# Patient Record
Sex: Female | Born: 1945
Health system: Southern US, Community
[De-identification: ages and names within clinical notes are randomized; demographics above are authoritative.]

## PROBLEM LIST (undated history)

## (undated) DIAGNOSIS — I4510 Unspecified right bundle-branch block: Secondary | ICD-10-CM

## (undated) DIAGNOSIS — K219 Gastro-esophageal reflux disease without esophagitis: Secondary | ICD-10-CM

## (undated) DIAGNOSIS — H269 Unspecified cataract: Secondary | ICD-10-CM

## (undated) DIAGNOSIS — I10 Essential (primary) hypertension: Secondary | ICD-10-CM

## (undated) DIAGNOSIS — J309 Allergic rhinitis, unspecified: Secondary | ICD-10-CM

## (undated) DIAGNOSIS — E785 Hyperlipidemia, unspecified: Secondary | ICD-10-CM

## (undated) DIAGNOSIS — T7840XA Allergy, unspecified, initial encounter: Secondary | ICD-10-CM

## (undated) DIAGNOSIS — M1990 Unspecified osteoarthritis, unspecified site: Secondary | ICD-10-CM

## (undated) DIAGNOSIS — E079 Disorder of thyroid, unspecified: Secondary | ICD-10-CM

## (undated) HISTORY — DX: Unspecified cataract: H26.9

## (undated) HISTORY — DX: Hyperlipidemia, unspecified: E78.5

## (undated) HISTORY — DX: Gastro-esophageal reflux disease without esophagitis: K21.9

## (undated) HISTORY — DX: Essential (primary) hypertension: I10

## (undated) HISTORY — DX: Disorder of thyroid, unspecified: E07.9

## (undated) HISTORY — PX: COLONOSCOPY: SHX174

## (undated) HISTORY — DX: Unspecified osteoarthritis, unspecified site: M19.90

## (undated) HISTORY — DX: Allergy, unspecified, initial encounter: T78.40XA

## (undated) HISTORY — PX: POLYPECTOMY: SHX149

## (undated) HISTORY — DX: Unspecified right bundle-branch block: I45.10

## (undated) HISTORY — DX: Allergic rhinitis, unspecified: J30.9

## (undated) HISTORY — PX: UPPER GASTROINTESTINAL ENDOSCOPY: SHX188

## (undated) HISTORY — PX: ABDOMINAL HYSTERECTOMY: SHX81

---
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
## 1997-10-01 ENCOUNTER — Other Ambulatory Visit: Admission: RE | Admit: 1997-10-01 | Discharge: 1997-10-01 | Payer: Self-pay | Admitting: Obstetrics and Gynecology

## 1998-01-22 ENCOUNTER — Emergency Department (HOSPITAL_COMMUNITY): Admission: EM | Admit: 1998-01-22 | Discharge: 1998-01-22 | Payer: Self-pay | Admitting: Internal Medicine

## 1998-08-03 ENCOUNTER — Other Ambulatory Visit: Admission: RE | Admit: 1998-08-03 | Discharge: 1998-08-03 | Payer: Self-pay | Admitting: Gastroenterology

## 1998-10-05 ENCOUNTER — Other Ambulatory Visit: Admission: RE | Admit: 1998-10-05 | Discharge: 1998-10-05 | Payer: Self-pay | Admitting: Obstetrics and Gynecology

## 1999-05-08 ENCOUNTER — Encounter: Payer: Self-pay | Admitting: Obstetrics and Gynecology

## 1999-05-08 ENCOUNTER — Encounter: Admission: RE | Admit: 1999-05-08 | Discharge: 1999-05-08 | Payer: Self-pay | Admitting: Obstetrics and Gynecology

## 1999-10-09 ENCOUNTER — Other Ambulatory Visit: Admission: RE | Admit: 1999-10-09 | Discharge: 1999-10-09 | Payer: Self-pay | Admitting: Obstetrics and Gynecology

## 2000-02-13 ENCOUNTER — Encounter: Admission: RE | Admit: 2000-02-13 | Discharge: 2000-02-13 | Payer: Self-pay | Admitting: Endocrinology

## 2000-02-13 ENCOUNTER — Encounter: Payer: Self-pay | Admitting: Endocrinology

## 2000-05-08 ENCOUNTER — Encounter: Payer: Self-pay | Admitting: Obstetrics and Gynecology

## 2000-05-08 ENCOUNTER — Encounter: Admission: RE | Admit: 2000-05-08 | Discharge: 2000-05-08 | Payer: Self-pay | Admitting: Obstetrics and Gynecology

## 2000-10-21 ENCOUNTER — Other Ambulatory Visit: Admission: RE | Admit: 2000-10-21 | Discharge: 2000-10-21 | Payer: Self-pay | Admitting: Obstetrics and Gynecology

## 2001-05-12 ENCOUNTER — Encounter: Admission: RE | Admit: 2001-05-12 | Discharge: 2001-05-12 | Payer: Self-pay | Admitting: Obstetrics and Gynecology

## 2001-05-12 ENCOUNTER — Encounter: Payer: Self-pay | Admitting: Obstetrics and Gynecology

## 2002-05-13 ENCOUNTER — Encounter: Admission: RE | Admit: 2002-05-13 | Discharge: 2002-05-13 | Payer: Self-pay | Admitting: Obstetrics and Gynecology

## 2002-05-13 ENCOUNTER — Encounter: Payer: Self-pay | Admitting: Obstetrics and Gynecology

## 2005-07-23 ENCOUNTER — Ambulatory Visit: Payer: Self-pay | Admitting: Gastroenterology

## 2005-08-08 ENCOUNTER — Ambulatory Visit: Payer: Self-pay | Admitting: Gastroenterology

## 2005-11-15 ENCOUNTER — Ambulatory Visit: Payer: Self-pay | Admitting: Cardiology

## 2005-12-06 ENCOUNTER — Ambulatory Visit: Payer: Self-pay

## 2005-12-06 ENCOUNTER — Encounter: Payer: Self-pay | Admitting: Cardiology

## 2009-09-30 ENCOUNTER — Encounter: Payer: Self-pay | Admitting: Cardiology

## 2009-09-30 ENCOUNTER — Ambulatory Visit: Payer: Self-pay

## 2009-09-30 ENCOUNTER — Ambulatory Visit: Payer: Self-pay | Admitting: Internal Medicine

## 2009-09-30 ENCOUNTER — Ambulatory Visit (HOSPITAL_COMMUNITY): Admission: RE | Admit: 2009-09-30 | Discharge: 2009-09-30 | Payer: Self-pay | Admitting: Endocrinology

## 2009-09-30 DIAGNOSIS — R002 Palpitations: Secondary | ICD-10-CM | POA: Insufficient documentation

## 2010-07-20 ENCOUNTER — Other Ambulatory Visit: Payer: Self-pay | Admitting: Obstetrics and Gynecology

## 2010-07-20 NOTE — Procedures (Signed)
Summary: Summary Report  Summary Report   Imported By: Erle Crocker 12/15/2009 11:15:58  _____________________________________________________________________  External Attachment:    Type:   Image     Comment:   External Document

## 2010-07-20 NOTE — Miscellaneous (Signed)
Summary: Orders Update  Clinical Lists Changes  Problems: Added new problem of PALPITATIONS (ICD-785.1) Orders: Added new Referral order of Echocardiogram (Echo) - Signed Added new Referral order of Holter Monitor (Holter Monitor) - Signed

## 2010-07-20 NOTE — Letter (Signed)
Summary: [facility] Office Note  [facility] Office Note   Imported By: Roderic Ovens 09/30/2009 13:30:26  _____________________________________________________________________  External Attachment:    Type:   Image     Comment:   External Document

## 2010-07-26 ENCOUNTER — Encounter (INDEPENDENT_AMBULATORY_CARE_PROVIDER_SITE_OTHER): Payer: Self-pay | Admitting: *Deleted

## 2010-07-28 ENCOUNTER — Encounter: Payer: Self-pay | Admitting: Gastroenterology

## 2010-08-03 NOTE — Miscellaneous (Signed)
Summary: [facility]  Clinical Lists Changes  Medications: Added new medication of MOVIPREP 100 GM  SOLR (PEG-KCL-NACL-NASULF-NA ASC-C) As per prep instructions. - Signed Rx of MOVIPREP 100 GM  SOLR (PEG-KCL-NACL-NASULF-NA ASC-C) As per prep instructions.;  #1 x 0;  Signed;  Entered by: Ezra Sites RN;  Authorized by: Meryl Dare MD [facility];  Method used: Electronically to [facility] # [phone]*, [address], [place], [place]  [address], Ph: [phone], Fax: [phone] Observations: Added new observation of NKA: T (07/28/2010 9:52)    Prescriptions: MOVIPREP 100 GM  SOLR (PEG-KCL-NACL-NASULF-NA ASC-C) As per prep instructions.  #1 x 0   Entered by:   Ezra Sites RN   Authorized by:   Meryl Dare MD [facility]   Signed by:   Ezra Sites RN on 07/28/2010   Method used:   Electronically to        [facility] # [phone]* (retail)       [address] [place], [place]  [address]       Ph: [phone]       Fax: [phone]   RxID:   [id number]

## 2010-08-03 NOTE — Letter (Signed)
Summary: Moviprep Instructions  [facility]  [address]   [place], [place] [address]   Phone: [phone]  Fax: [phone]       Kristen Pena    27-Nov-1945    MRN: [id number]        Procedure Day /Date: Monday, 08-21-10     Arrival Time: 10:00 a.m.     Procedure Time: 11:00 a.m.     Location of Procedure:                    x  [facility] (4th Floor)                        PREPARATION FOR COLONOSCOPY WITH MOVIPREP   Starting 5 days prior to your procedure 08-16-10 do not eat nuts, seeds, popcorn, corn, beans, peas,  salads, or any raw vegetables.  Do not take any fiber supplements (e.g. Metamucil, Citrucel, and Benefiber).  THE DAY BEFORE YOUR PROCEDURE         DATE: 08-20-10   DAY: Sunday  1.  Drink clear liquids the entire day-NO SOLID FOOD  2.  Do not drink anything colored red or purple.  Avoid juices with pulp.  No orange juice.  3.  Drink at least 64 oz. (8 glasses) of fluid/clear liquids during the day to prevent dehydration and help the prep work efficiently.  CLEAR LIQUIDS INCLUDE: Water Jello Ice Popsicles Tea (sugar ok, no milk/cream) Powdered fruit flavored drinks Coffee (sugar ok, no milk/cream) Gatorade Juice: apple, white grape, white cranberry  Lemonade Clear bullion, consomm, broth Carbonated beverages (any kind) Strained chicken noodle soup Hard Candy                             4.  In the morning, mix first dose of MoviPrep solution:    Empty 1 Pouch A and 1 Pouch B into the disposable container    Add lukewarm drinking water to the top line of the container. Mix to dissolve    Refrigerate (mixed solution should be used within 24 hrs)  5.  Begin drinking the prep at 5:00 p.m. The MoviPrep container is divided by 4 marks.   Every 15 minutes drink the solution down to the next mark (approximately 8 oz) until the full liter is complete.   6.  Follow completed prep with 16 oz of clear liquid of your choice  (Nothing red or purple).  Continue to drink clear liquids until bedtime.  7.  Before going to bed, mix second dose of MoviPrep solution:    Empty 1 Pouch A and 1 Pouch B into the disposable container    Add lukewarm drinking water to the top line of the container. Mix to dissolve    Refrigerate  THE DAY OF YOUR PROCEDURE      DATE: 08-21-10 DAY: Monday  Beginning at 6:00 a.m. (5 hours before procedure):         1. Every 15 minutes, drink the solution down to the next mark (approx 8 oz) until the full liter is complete.  2. Follow completed prep with 16 oz. of clear liquid of your choice.    3. You may drink clear liquids until 9:00 a.m.  (2 HOURS BEFORE PROCEDURE).   MEDICATION INSTRUCTIONS  Unless otherwise instructed, you should take regular prescription medications with a small sip of water   as early as possible the morning  of your procedure.  Additional medication instructions: Do not take HCTZ day of procedure.         OTHER INSTRUCTIONS  You will need a responsible adult at least 66 years of age to accompany you and drive you home.   This person must remain in the waiting room during your procedure.  Wear loose fitting clothing that is easily removed.  Leave jewelry and other valuables at home.  However, you may wish to bring a book to read or  an iPod/MP3 player to listen to music as you wait for your procedure to start.  Remove all body piercing jewelry and leave at home.  Total time from sign-in until discharge is approximately 2-3 hours.  You should go home directly after your procedure and rest.  You can resume normal activities the  day after your procedure.  The day of your procedure you should not:   Drive   Make legal decisions   Operate machinery   Drink alcohol   Return to work  You will receive specific instructions about eating, activities and medications before you leave.    The above instructions have been reviewed and explained to  me by   Ezra Sites RN  July 28, 2010 10:21 AM     I fully understand and can verbalize these instructions _____________________________ Date _________

## 2010-08-11 ENCOUNTER — Other Ambulatory Visit: Payer: Self-pay | Admitting: Gastroenterology

## 2010-08-21 ENCOUNTER — Encounter: Payer: Self-pay | Admitting: Gastroenterology

## 2010-08-21 ENCOUNTER — Other Ambulatory Visit (AMBULATORY_SURGERY_CENTER): Payer: Medicare Other | Admitting: Gastroenterology

## 2010-08-21 DIAGNOSIS — Z800 Family history of malignant neoplasm of digestive organs: Secondary | ICD-10-CM

## 2010-08-21 DIAGNOSIS — Z1211 Encounter for screening for malignant neoplasm of colon: Secondary | ICD-10-CM

## 2010-08-29 NOTE — Procedures (Addendum)
Summary: Colonoscopy  Patient: Kristen Pena Note: All result statuses are Final unless otherwise noted.  Tests: (1) Colonoscopy (COL)   COL Colonoscopy           DONE (C)     [facility]     [address]     [place], [place]  [address]          COLONOSCOPY PROCEDURE REPORT          PATIENT:  Kristen Pena, Kristen Pena  MR#:  [id number]     BIRTHDATE:  10-10-1945, 64 yrs. old  GENDER:  female     ENDOSCOPIST:  Judie Petit T. Russella Dar, MD, [facility]          PROCEDURE DATE:  08/21/2010     PROCEDURE:  High risk screening colonoscopy G0105     ASA CLASS:  Class II     INDICATIONS:  1) Elevated Risk Screening  2) family history of     colon cancer: mother at 25.     MEDICATIONS:   Fentanyl 75 mcg IV, Versed 9 mg IV     DESCRIPTION OF PROCEDURE:   After the risks benefits and     alternatives of the procedure were thoroughly explained, informed     consent was obtained.  Digital rectal exam was performed and     revealed no abnormalities.   The LB PCF-H180AL [id number] endoscope     was introduced through the anus and advanced to the cecum, which     was identified by both the appendix and ileocecal valve, limited     by a tortuous colon.    The quality of the prep was excellent,     using MoviPrep.  The instrument was then slowly withdrawn as the     colon was fully examined.     <<PROCEDUREIMAGES>>     FINDINGS:  A normal appearing cecum, ileocecal valve, and     appendiceal orifice were identified. The ascending, hepatic     flexure, transverse, splenic flexure, descending, sigmoid colon,     and rectum appeared unremarkable.   Retroflexed views in the     rectum revealed no abnormalities. The time to cecum =  4  minutes.     The scope was then withdrawn (time =  9.5  min) from the patient     and the procedure completed.          COMPLICATIONS:  None          ENDOSCOPIC IMPRESSION:     1) Normal colon          RECOMMENDATIONS:     1) Repeat Colonoscopy in 5 years.       Venita Lick. Russella Dar, MD, [facility]          CC:  Adrian Prince, MD          n.     REVISED:  08/21/2010 12:16 PM     eSIGNED:   Judie Petit T. Marlow Berenguer at 08/21/2010 12:16 PM          Jesusita Oka, [id number]  Note: An exclamation mark (!) indicates a result that was not dispersed into the flowsheet. Document Creation Date: 08/21/2010 12:16 PM _______________________________________________________________________  (1) Order result status: Final Collection or observation date-time: 08/21/2010 11:38 Requested date-time:  Receipt date-time:  Reported date-time:  Referring Physician:   Ordering Physician: Claudette Head [phone]) Specimen Source:  Source: Launa Grill Order Number: [id number] Lab site:

## 2011-08-27 DIAGNOSIS — H1130 Conjunctival hemorrhage, unspecified eye: Secondary | ICD-10-CM | POA: Diagnosis not present

## 2011-08-27 DIAGNOSIS — H31019 Macula scars of posterior pole (postinflammatory) (post-traumatic), unspecified eye: Secondary | ICD-10-CM | POA: Diagnosis not present

## 2011-08-27 DIAGNOSIS — H53009 Unspecified amblyopia, unspecified eye: Secondary | ICD-10-CM | POA: Diagnosis not present

## 2011-08-27 DIAGNOSIS — H04129 Dry eye syndrome of unspecified lacrimal gland: Secondary | ICD-10-CM | POA: Diagnosis not present

## 2011-09-10 DIAGNOSIS — R7401 Elevation of levels of liver transaminase levels: Secondary | ICD-10-CM | POA: Diagnosis not present

## 2011-09-10 DIAGNOSIS — E559 Vitamin D deficiency, unspecified: Secondary | ICD-10-CM | POA: Diagnosis not present

## 2011-09-10 DIAGNOSIS — E0520 Thyrotoxicosis with toxic multinodular goiter without thyrotoxic crisis or storm: Secondary | ICD-10-CM | POA: Diagnosis not present

## 2011-09-10 DIAGNOSIS — E785 Hyperlipidemia, unspecified: Secondary | ICD-10-CM | POA: Diagnosis not present

## 2011-09-10 DIAGNOSIS — R7402 Elevation of levels of lactic acid dehydrogenase (LDH): Secondary | ICD-10-CM | POA: Diagnosis not present

## 2011-09-28 DIAGNOSIS — K1120 Sialoadenitis, unspecified: Secondary | ICD-10-CM | POA: Diagnosis not present

## 2011-10-03 DIAGNOSIS — Z01419 Encounter for gynecological examination (general) (routine) without abnormal findings: Secondary | ICD-10-CM | POA: Diagnosis not present

## 2011-10-03 DIAGNOSIS — Z1231 Encounter for screening mammogram for malignant neoplasm of breast: Secondary | ICD-10-CM | POA: Diagnosis not present

## 2011-10-03 DIAGNOSIS — Z124 Encounter for screening for malignant neoplasm of cervix: Secondary | ICD-10-CM | POA: Diagnosis not present

## 2011-12-28 DIAGNOSIS — E559 Vitamin D deficiency, unspecified: Secondary | ICD-10-CM | POA: Diagnosis not present

## 2011-12-28 DIAGNOSIS — R209 Unspecified disturbances of skin sensation: Secondary | ICD-10-CM | POA: Diagnosis not present

## 2011-12-28 DIAGNOSIS — E0520 Thyrotoxicosis with toxic multinodular goiter without thyrotoxic crisis or storm: Secondary | ICD-10-CM | POA: Diagnosis not present

## 2011-12-28 DIAGNOSIS — Z0000 Encounter for general adult medical examination without abnormal findings: Secondary | ICD-10-CM | POA: Diagnosis not present

## 2012-01-04 DIAGNOSIS — Q828 Other specified congenital malformations of skin: Secondary | ICD-10-CM | POA: Diagnosis not present

## 2012-01-04 DIAGNOSIS — B079 Viral wart, unspecified: Secondary | ICD-10-CM | POA: Diagnosis not present

## 2012-03-14 DIAGNOSIS — R7401 Elevation of levels of liver transaminase levels: Secondary | ICD-10-CM | POA: Diagnosis not present

## 2012-03-14 DIAGNOSIS — E559 Vitamin D deficiency, unspecified: Secondary | ICD-10-CM | POA: Diagnosis not present

## 2012-03-14 DIAGNOSIS — E0520 Thyrotoxicosis with toxic multinodular goiter without thyrotoxic crisis or storm: Secondary | ICD-10-CM | POA: Diagnosis not present

## 2012-03-14 DIAGNOSIS — Z23 Encounter for immunization: Secondary | ICD-10-CM | POA: Diagnosis not present

## 2012-03-14 DIAGNOSIS — E785 Hyperlipidemia, unspecified: Secondary | ICD-10-CM | POA: Diagnosis not present

## 2012-03-14 DIAGNOSIS — R7402 Elevation of levels of lactic acid dehydrogenase (LDH): Secondary | ICD-10-CM | POA: Diagnosis not present

## 2012-09-25 DIAGNOSIS — M899 Disorder of bone, unspecified: Secondary | ICD-10-CM | POA: Diagnosis not present

## 2012-09-25 DIAGNOSIS — E785 Hyperlipidemia, unspecified: Secondary | ICD-10-CM | POA: Diagnosis not present

## 2012-09-25 DIAGNOSIS — M949 Disorder of cartilage, unspecified: Secondary | ICD-10-CM | POA: Diagnosis not present

## 2012-09-25 DIAGNOSIS — E0520 Thyrotoxicosis with toxic multinodular goiter without thyrotoxic crisis or storm: Secondary | ICD-10-CM | POA: Diagnosis not present

## 2012-10-02 DIAGNOSIS — H2510 Age-related nuclear cataract, unspecified eye: Secondary | ICD-10-CM | POA: Diagnosis not present

## 2012-10-02 DIAGNOSIS — H04129 Dry eye syndrome of unspecified lacrimal gland: Secondary | ICD-10-CM | POA: Diagnosis not present

## 2012-10-07 DIAGNOSIS — M899 Disorder of bone, unspecified: Secondary | ICD-10-CM | POA: Diagnosis not present

## 2012-10-07 DIAGNOSIS — R7402 Elevation of levels of lactic acid dehydrogenase (LDH): Secondary | ICD-10-CM | POA: Diagnosis not present

## 2012-10-07 DIAGNOSIS — E559 Vitamin D deficiency, unspecified: Secondary | ICD-10-CM | POA: Diagnosis not present

## 2012-10-07 DIAGNOSIS — M949 Disorder of cartilage, unspecified: Secondary | ICD-10-CM | POA: Diagnosis not present

## 2012-10-07 DIAGNOSIS — E0520 Thyrotoxicosis with toxic multinodular goiter without thyrotoxic crisis or storm: Secondary | ICD-10-CM | POA: Diagnosis not present

## 2012-10-07 DIAGNOSIS — E785 Hyperlipidemia, unspecified: Secondary | ICD-10-CM | POA: Diagnosis not present

## 2012-10-07 DIAGNOSIS — R7401 Elevation of levels of liver transaminase levels: Secondary | ICD-10-CM | POA: Diagnosis not present

## 2012-10-28 DIAGNOSIS — Z1231 Encounter for screening mammogram for malignant neoplasm of breast: Secondary | ICD-10-CM | POA: Diagnosis not present

## 2012-10-28 DIAGNOSIS — Z1289 Encounter for screening for malignant neoplasm of other sites: Secondary | ICD-10-CM | POA: Diagnosis not present

## 2012-11-04 DIAGNOSIS — Z1211 Encounter for screening for malignant neoplasm of colon: Secondary | ICD-10-CM | POA: Diagnosis not present

## 2012-12-03 DIAGNOSIS — IMO0002 Reserved for concepts with insufficient information to code with codable children: Secondary | ICD-10-CM | POA: Diagnosis not present

## 2012-12-03 DIAGNOSIS — M1710 Unilateral primary osteoarthritis, unspecified knee: Secondary | ICD-10-CM | POA: Diagnosis not present

## 2013-02-04 DIAGNOSIS — IMO0002 Reserved for concepts with insufficient information to code with codable children: Secondary | ICD-10-CM | POA: Diagnosis not present

## 2013-02-04 DIAGNOSIS — M1710 Unilateral primary osteoarthritis, unspecified knee: Secondary | ICD-10-CM | POA: Diagnosis not present

## 2013-02-11 DIAGNOSIS — M1710 Unilateral primary osteoarthritis, unspecified knee: Secondary | ICD-10-CM | POA: Diagnosis not present

## 2013-02-11 DIAGNOSIS — IMO0002 Reserved for concepts with insufficient information to code with codable children: Secondary | ICD-10-CM | POA: Diagnosis not present

## 2013-02-18 DIAGNOSIS — M1710 Unilateral primary osteoarthritis, unspecified knee: Secondary | ICD-10-CM | POA: Diagnosis not present

## 2013-02-18 DIAGNOSIS — IMO0002 Reserved for concepts with insufficient information to code with codable children: Secondary | ICD-10-CM | POA: Diagnosis not present

## 2013-03-11 DIAGNOSIS — M25519 Pain in unspecified shoulder: Secondary | ICD-10-CM | POA: Diagnosis not present

## 2013-03-11 DIAGNOSIS — M19019 Primary osteoarthritis, unspecified shoulder: Secondary | ICD-10-CM | POA: Diagnosis not present

## 2013-03-11 DIAGNOSIS — M7500 Adhesive capsulitis of unspecified shoulder: Secondary | ICD-10-CM | POA: Diagnosis not present

## 2013-03-16 DIAGNOSIS — M25519 Pain in unspecified shoulder: Secondary | ICD-10-CM | POA: Diagnosis not present

## 2013-03-16 DIAGNOSIS — M7500 Adhesive capsulitis of unspecified shoulder: Secondary | ICD-10-CM | POA: Diagnosis not present

## 2013-03-18 DIAGNOSIS — M25519 Pain in unspecified shoulder: Secondary | ICD-10-CM | POA: Diagnosis not present

## 2013-03-18 DIAGNOSIS — M7500 Adhesive capsulitis of unspecified shoulder: Secondary | ICD-10-CM | POA: Diagnosis not present

## 2013-03-20 DIAGNOSIS — M25519 Pain in unspecified shoulder: Secondary | ICD-10-CM | POA: Diagnosis not present

## 2013-03-20 DIAGNOSIS — M7500 Adhesive capsulitis of unspecified shoulder: Secondary | ICD-10-CM | POA: Diagnosis not present

## 2013-03-23 DIAGNOSIS — M25519 Pain in unspecified shoulder: Secondary | ICD-10-CM | POA: Diagnosis not present

## 2013-03-23 DIAGNOSIS — M7500 Adhesive capsulitis of unspecified shoulder: Secondary | ICD-10-CM | POA: Diagnosis not present

## 2013-03-24 DIAGNOSIS — M25519 Pain in unspecified shoulder: Secondary | ICD-10-CM | POA: Diagnosis not present

## 2013-03-24 DIAGNOSIS — M7500 Adhesive capsulitis of unspecified shoulder: Secondary | ICD-10-CM | POA: Diagnosis not present

## 2013-03-25 DIAGNOSIS — IMO0002 Reserved for concepts with insufficient information to code with codable children: Secondary | ICD-10-CM | POA: Diagnosis not present

## 2013-03-25 DIAGNOSIS — M1710 Unilateral primary osteoarthritis, unspecified knee: Secondary | ICD-10-CM | POA: Diagnosis not present

## 2013-03-26 DIAGNOSIS — E559 Vitamin D deficiency, unspecified: Secondary | ICD-10-CM | POA: Diagnosis not present

## 2013-03-26 DIAGNOSIS — E785 Hyperlipidemia, unspecified: Secondary | ICD-10-CM | POA: Diagnosis not present

## 2013-03-26 DIAGNOSIS — E0520 Thyrotoxicosis with toxic multinodular goiter without thyrotoxic crisis or storm: Secondary | ICD-10-CM | POA: Diagnosis not present

## 2013-03-27 DIAGNOSIS — M7500 Adhesive capsulitis of unspecified shoulder: Secondary | ICD-10-CM | POA: Diagnosis not present

## 2013-03-27 DIAGNOSIS — M25519 Pain in unspecified shoulder: Secondary | ICD-10-CM | POA: Diagnosis not present

## 2013-03-30 DIAGNOSIS — M7500 Adhesive capsulitis of unspecified shoulder: Secondary | ICD-10-CM | POA: Diagnosis not present

## 2013-03-30 DIAGNOSIS — M25519 Pain in unspecified shoulder: Secondary | ICD-10-CM | POA: Diagnosis not present

## 2013-04-01 DIAGNOSIS — IMO0002 Reserved for concepts with insufficient information to code with codable children: Secondary | ICD-10-CM | POA: Diagnosis not present

## 2013-04-01 DIAGNOSIS — M1710 Unilateral primary osteoarthritis, unspecified knee: Secondary | ICD-10-CM | POA: Diagnosis not present

## 2013-04-03 DIAGNOSIS — M7500 Adhesive capsulitis of unspecified shoulder: Secondary | ICD-10-CM | POA: Diagnosis not present

## 2013-04-03 DIAGNOSIS — M25519 Pain in unspecified shoulder: Secondary | ICD-10-CM | POA: Diagnosis not present

## 2013-04-06 DIAGNOSIS — M7500 Adhesive capsulitis of unspecified shoulder: Secondary | ICD-10-CM | POA: Diagnosis not present

## 2013-04-06 DIAGNOSIS — M25519 Pain in unspecified shoulder: Secondary | ICD-10-CM | POA: Diagnosis not present

## 2013-04-07 DIAGNOSIS — M25519 Pain in unspecified shoulder: Secondary | ICD-10-CM | POA: Diagnosis not present

## 2013-04-07 DIAGNOSIS — M7500 Adhesive capsulitis of unspecified shoulder: Secondary | ICD-10-CM | POA: Diagnosis not present

## 2013-04-08 DIAGNOSIS — IMO0002 Reserved for concepts with insufficient information to code with codable children: Secondary | ICD-10-CM | POA: Diagnosis not present

## 2013-04-08 DIAGNOSIS — M1710 Unilateral primary osteoarthritis, unspecified knee: Secondary | ICD-10-CM | POA: Diagnosis not present

## 2013-04-10 DIAGNOSIS — IMO0002 Reserved for concepts with insufficient information to code with codable children: Secondary | ICD-10-CM | POA: Diagnosis not present

## 2013-04-10 DIAGNOSIS — E0520 Thyrotoxicosis with toxic multinodular goiter without thyrotoxic crisis or storm: Secondary | ICD-10-CM | POA: Diagnosis not present

## 2013-04-10 DIAGNOSIS — M899 Disorder of bone, unspecified: Secondary | ICD-10-CM | POA: Diagnosis not present

## 2013-04-10 DIAGNOSIS — R7401 Elevation of levels of liver transaminase levels: Secondary | ICD-10-CM | POA: Diagnosis not present

## 2013-04-10 DIAGNOSIS — Z23 Encounter for immunization: Secondary | ICD-10-CM | POA: Diagnosis not present

## 2013-04-10 DIAGNOSIS — E785 Hyperlipidemia, unspecified: Secondary | ICD-10-CM | POA: Diagnosis not present

## 2013-04-10 DIAGNOSIS — R7402 Elevation of levels of lactic acid dehydrogenase (LDH): Secondary | ICD-10-CM | POA: Diagnosis not present

## 2013-04-10 DIAGNOSIS — E559 Vitamin D deficiency, unspecified: Secondary | ICD-10-CM | POA: Diagnosis not present

## 2013-04-13 DIAGNOSIS — M25519 Pain in unspecified shoulder: Secondary | ICD-10-CM | POA: Diagnosis not present

## 2013-04-13 DIAGNOSIS — M7500 Adhesive capsulitis of unspecified shoulder: Secondary | ICD-10-CM | POA: Diagnosis not present

## 2013-04-17 DIAGNOSIS — M25519 Pain in unspecified shoulder: Secondary | ICD-10-CM | POA: Diagnosis not present

## 2013-04-17 DIAGNOSIS — M7500 Adhesive capsulitis of unspecified shoulder: Secondary | ICD-10-CM | POA: Diagnosis not present

## 2013-04-24 DIAGNOSIS — J329 Chronic sinusitis, unspecified: Secondary | ICD-10-CM | POA: Diagnosis not present

## 2013-05-27 DIAGNOSIS — M1710 Unilateral primary osteoarthritis, unspecified knee: Secondary | ICD-10-CM | POA: Diagnosis not present

## 2013-05-27 DIAGNOSIS — IMO0002 Reserved for concepts with insufficient information to code with codable children: Secondary | ICD-10-CM | POA: Diagnosis not present

## 2013-05-29 DIAGNOSIS — J029 Acute pharyngitis, unspecified: Secondary | ICD-10-CM | POA: Diagnosis not present

## 2013-05-29 DIAGNOSIS — Z6825 Body mass index (BMI) 25.0-25.9, adult: Secondary | ICD-10-CM | POA: Diagnosis not present

## 2013-06-24 DIAGNOSIS — N390 Urinary tract infection, site not specified: Secondary | ICD-10-CM | POA: Diagnosis not present

## 2013-06-24 DIAGNOSIS — R300 Dysuria: Secondary | ICD-10-CM | POA: Diagnosis not present

## 2013-06-25 DIAGNOSIS — J3089 Other allergic rhinitis: Secondary | ICD-10-CM | POA: Diagnosis not present

## 2013-06-25 DIAGNOSIS — J301 Allergic rhinitis due to pollen: Secondary | ICD-10-CM | POA: Diagnosis not present

## 2013-06-25 DIAGNOSIS — J3081 Allergic rhinitis due to animal (cat) (dog) hair and dander: Secondary | ICD-10-CM | POA: Diagnosis not present

## 2013-07-04 DIAGNOSIS — J301 Allergic rhinitis due to pollen: Secondary | ICD-10-CM | POA: Diagnosis not present

## 2013-09-24 DIAGNOSIS — E0520 Thyrotoxicosis with toxic multinodular goiter without thyrotoxic crisis or storm: Secondary | ICD-10-CM | POA: Diagnosis not present

## 2013-09-24 DIAGNOSIS — E559 Vitamin D deficiency, unspecified: Secondary | ICD-10-CM | POA: Diagnosis not present

## 2013-09-24 DIAGNOSIS — E785 Hyperlipidemia, unspecified: Secondary | ICD-10-CM | POA: Diagnosis not present

## 2013-10-14 DIAGNOSIS — Z6828 Body mass index (BMI) 28.0-28.9, adult: Secondary | ICD-10-CM | POA: Diagnosis not present

## 2013-10-14 DIAGNOSIS — Z1331 Encounter for screening for depression: Secondary | ICD-10-CM | POA: Diagnosis not present

## 2013-10-14 DIAGNOSIS — E0520 Thyrotoxicosis with toxic multinodular goiter without thyrotoxic crisis or storm: Secondary | ICD-10-CM | POA: Diagnosis not present

## 2013-10-14 DIAGNOSIS — R809 Proteinuria, unspecified: Secondary | ICD-10-CM | POA: Diagnosis not present

## 2013-10-14 DIAGNOSIS — R7402 Elevation of levels of lactic acid dehydrogenase (LDH): Secondary | ICD-10-CM | POA: Diagnosis not present

## 2013-10-14 DIAGNOSIS — E785 Hyperlipidemia, unspecified: Secondary | ICD-10-CM | POA: Diagnosis not present

## 2013-10-14 DIAGNOSIS — R7401 Elevation of levels of liver transaminase levels: Secondary | ICD-10-CM | POA: Diagnosis not present

## 2013-10-14 DIAGNOSIS — R300 Dysuria: Secondary | ICD-10-CM | POA: Diagnosis not present

## 2013-10-14 DIAGNOSIS — E559 Vitamin D deficiency, unspecified: Secondary | ICD-10-CM | POA: Diagnosis not present

## 2013-10-15 DIAGNOSIS — IMO0002 Reserved for concepts with insufficient information to code with codable children: Secondary | ICD-10-CM | POA: Diagnosis not present

## 2013-10-15 DIAGNOSIS — M1710 Unilateral primary osteoarthritis, unspecified knee: Secondary | ICD-10-CM | POA: Diagnosis not present

## 2013-10-18 DIAGNOSIS — N390 Urinary tract infection, site not specified: Secondary | ICD-10-CM | POA: Diagnosis not present

## 2013-10-18 DIAGNOSIS — N3289 Other specified disorders of bladder: Secondary | ICD-10-CM | POA: Diagnosis not present

## 2013-11-12 DIAGNOSIS — H2510 Age-related nuclear cataract, unspecified eye: Secondary | ICD-10-CM | POA: Diagnosis not present

## 2013-11-12 DIAGNOSIS — H04129 Dry eye syndrome of unspecified lacrimal gland: Secondary | ICD-10-CM | POA: Diagnosis not present

## 2013-11-12 DIAGNOSIS — H31009 Unspecified chorioretinal scars, unspecified eye: Secondary | ICD-10-CM | POA: Diagnosis not present

## 2013-11-12 DIAGNOSIS — H524 Presbyopia: Secondary | ICD-10-CM | POA: Diagnosis not present

## 2013-11-13 DIAGNOSIS — N760 Acute vaginitis: Secondary | ICD-10-CM | POA: Diagnosis not present

## 2013-11-13 DIAGNOSIS — Z1231 Encounter for screening mammogram for malignant neoplasm of breast: Secondary | ICD-10-CM | POA: Diagnosis not present

## 2013-11-13 DIAGNOSIS — N390 Urinary tract infection, site not specified: Secondary | ICD-10-CM | POA: Diagnosis not present

## 2014-02-11 DIAGNOSIS — R05 Cough: Secondary | ICD-10-CM | POA: Diagnosis not present

## 2014-02-11 DIAGNOSIS — R059 Cough, unspecified: Secondary | ICD-10-CM | POA: Diagnosis not present

## 2014-02-11 DIAGNOSIS — J3489 Other specified disorders of nose and nasal sinuses: Secondary | ICD-10-CM | POA: Diagnosis not present

## 2014-04-08 DIAGNOSIS — E785 Hyperlipidemia, unspecified: Secondary | ICD-10-CM | POA: Diagnosis not present

## 2014-04-08 DIAGNOSIS — R945 Abnormal results of liver function studies: Secondary | ICD-10-CM | POA: Diagnosis not present

## 2014-04-08 DIAGNOSIS — E0520 Thyrotoxicosis with toxic multinodular goiter without thyrotoxic crisis or storm: Secondary | ICD-10-CM | POA: Diagnosis not present

## 2014-04-14 DIAGNOSIS — E0520 Thyrotoxicosis with toxic multinodular goiter without thyrotoxic crisis or storm: Secondary | ICD-10-CM | POA: Diagnosis not present

## 2014-04-14 DIAGNOSIS — Z23 Encounter for immunization: Secondary | ICD-10-CM | POA: Diagnosis not present

## 2014-04-14 DIAGNOSIS — Z6824 Body mass index (BMI) 24.0-24.9, adult: Secondary | ICD-10-CM | POA: Diagnosis not present

## 2014-04-14 DIAGNOSIS — M8580 Other specified disorders of bone density and structure, unspecified site: Secondary | ICD-10-CM | POA: Diagnosis not present

## 2014-04-14 DIAGNOSIS — E559 Vitamin D deficiency, unspecified: Secondary | ICD-10-CM | POA: Diagnosis not present

## 2014-04-14 DIAGNOSIS — E785 Hyperlipidemia, unspecified: Secondary | ICD-10-CM | POA: Diagnosis not present

## 2014-04-14 DIAGNOSIS — R740 Nonspecific elevation of levels of transaminase and lactic acid dehydrogenase [LDH]: Secondary | ICD-10-CM | POA: Diagnosis not present

## 2014-08-25 DIAGNOSIS — Z6824 Body mass index (BMI) 24.0-24.9, adult: Secondary | ICD-10-CM | POA: Diagnosis not present

## 2014-08-25 DIAGNOSIS — M859 Disorder of bone density and structure, unspecified: Secondary | ICD-10-CM | POA: Diagnosis not present

## 2014-08-25 DIAGNOSIS — R740 Nonspecific elevation of levels of transaminase and lactic acid dehydrogenase [LDH]: Secondary | ICD-10-CM | POA: Diagnosis not present

## 2014-08-25 DIAGNOSIS — E559 Vitamin D deficiency, unspecified: Secondary | ICD-10-CM | POA: Diagnosis not present

## 2014-08-25 DIAGNOSIS — E785 Hyperlipidemia, unspecified: Secondary | ICD-10-CM | POA: Diagnosis not present

## 2014-08-25 DIAGNOSIS — R209 Unspecified disturbances of skin sensation: Secondary | ICD-10-CM | POA: Diagnosis not present

## 2014-08-25 DIAGNOSIS — E0520 Thyrotoxicosis with toxic multinodular goiter without thyrotoxic crisis or storm: Secondary | ICD-10-CM | POA: Diagnosis not present

## 2014-09-03 ENCOUNTER — Ambulatory Visit: Payer: Self-pay

## 2014-09-09 DIAGNOSIS — H2513 Age-related nuclear cataract, bilateral: Secondary | ICD-10-CM | POA: Diagnosis not present

## 2014-09-20 DIAGNOSIS — E0520 Thyrotoxicosis with toxic multinodular goiter without thyrotoxic crisis or storm: Secondary | ICD-10-CM | POA: Diagnosis not present

## 2014-09-22 DIAGNOSIS — Z6824 Body mass index (BMI) 24.0-24.9, adult: Secondary | ICD-10-CM | POA: Diagnosis not present

## 2014-09-22 DIAGNOSIS — E785 Hyperlipidemia, unspecified: Secondary | ICD-10-CM | POA: Diagnosis not present

## 2014-09-22 DIAGNOSIS — M859 Disorder of bone density and structure, unspecified: Secondary | ICD-10-CM | POA: Diagnosis not present

## 2014-09-22 DIAGNOSIS — Z1389 Encounter for screening for other disorder: Secondary | ICD-10-CM | POA: Diagnosis not present

## 2014-09-22 DIAGNOSIS — E559 Vitamin D deficiency, unspecified: Secondary | ICD-10-CM | POA: Diagnosis not present

## 2014-09-22 DIAGNOSIS — R740 Nonspecific elevation of levels of transaminase and lactic acid dehydrogenase [LDH]: Secondary | ICD-10-CM | POA: Diagnosis not present

## 2014-09-22 DIAGNOSIS — E0520 Thyrotoxicosis with toxic multinodular goiter without thyrotoxic crisis or storm: Secondary | ICD-10-CM | POA: Diagnosis not present

## 2014-10-01 ENCOUNTER — Ambulatory Visit: Payer: Medicare Other

## 2014-11-04 ENCOUNTER — Encounter: Payer: Self-pay | Admitting: Gastroenterology

## 2014-12-24 ENCOUNTER — Other Ambulatory Visit: Payer: Self-pay | Admitting: Obstetrics and Gynecology

## 2014-12-24 DIAGNOSIS — Z124 Encounter for screening for malignant neoplasm of cervix: Secondary | ICD-10-CM | POA: Diagnosis not present

## 2014-12-24 DIAGNOSIS — Z6824 Body mass index (BMI) 24.0-24.9, adult: Secondary | ICD-10-CM | POA: Diagnosis not present

## 2014-12-24 DIAGNOSIS — Z01419 Encounter for gynecological examination (general) (routine) without abnormal findings: Secondary | ICD-10-CM | POA: Diagnosis not present

## 2014-12-24 DIAGNOSIS — Z1231 Encounter for screening mammogram for malignant neoplasm of breast: Secondary | ICD-10-CM | POA: Diagnosis not present

## 2014-12-24 DIAGNOSIS — M79641 Pain in right hand: Secondary | ICD-10-CM | POA: Diagnosis not present

## 2014-12-27 LAB — CYTOLOGY - PAP

## 2015-01-11 DIAGNOSIS — M25311 Other instability, right shoulder: Secondary | ICD-10-CM | POA: Diagnosis not present

## 2015-01-11 DIAGNOSIS — M65312 Trigger thumb, left thumb: Secondary | ICD-10-CM | POA: Diagnosis not present

## 2015-01-11 DIAGNOSIS — M65311 Trigger thumb, right thumb: Secondary | ICD-10-CM | POA: Diagnosis not present

## 2015-01-28 DIAGNOSIS — Z0000 Encounter for general adult medical examination without abnormal findings: Secondary | ICD-10-CM | POA: Diagnosis not present

## 2015-01-28 DIAGNOSIS — E0520 Thyrotoxicosis with toxic multinodular goiter without thyrotoxic crisis or storm: Secondary | ICD-10-CM | POA: Diagnosis not present

## 2015-01-28 DIAGNOSIS — M79641 Pain in right hand: Secondary | ICD-10-CM | POA: Diagnosis not present

## 2015-01-28 DIAGNOSIS — M79644 Pain in right finger(s): Secondary | ICD-10-CM | POA: Diagnosis not present

## 2015-02-07 DIAGNOSIS — E0520 Thyrotoxicosis with toxic multinodular goiter without thyrotoxic crisis or storm: Secondary | ICD-10-CM | POA: Diagnosis not present

## 2015-02-07 DIAGNOSIS — G5792 Unspecified mononeuropathy of left lower limb: Secondary | ICD-10-CM | POA: Diagnosis not present

## 2015-02-07 DIAGNOSIS — M79641 Pain in right hand: Secondary | ICD-10-CM | POA: Diagnosis not present

## 2015-02-07 DIAGNOSIS — M859 Disorder of bone density and structure, unspecified: Secondary | ICD-10-CM | POA: Diagnosis not present

## 2015-02-07 DIAGNOSIS — Z6825 Body mass index (BMI) 25.0-25.9, adult: Secondary | ICD-10-CM | POA: Diagnosis not present

## 2015-02-07 DIAGNOSIS — E785 Hyperlipidemia, unspecified: Secondary | ICD-10-CM | POA: Diagnosis not present

## 2015-02-07 DIAGNOSIS — G5791 Unspecified mononeuropathy of right lower limb: Secondary | ICD-10-CM | POA: Diagnosis not present

## 2015-02-07 DIAGNOSIS — E559 Vitamin D deficiency, unspecified: Secondary | ICD-10-CM | POA: Diagnosis not present

## 2015-02-07 DIAGNOSIS — R740 Nonspecific elevation of levels of transaminase and lactic acid dehydrogenase [LDH]: Secondary | ICD-10-CM | POA: Diagnosis not present

## 2015-03-04 ENCOUNTER — Ambulatory Visit (INDEPENDENT_AMBULATORY_CARE_PROVIDER_SITE_OTHER): Payer: Medicare Other | Admitting: Podiatry

## 2015-03-04 ENCOUNTER — Encounter: Payer: Self-pay | Admitting: Podiatry

## 2015-03-04 VITALS — BP 101/63 | HR 58 | Resp 16 | Ht 66.0 in | Wt 150.0 lb

## 2015-03-04 DIAGNOSIS — B351 Tinea unguium: Secondary | ICD-10-CM | POA: Diagnosis not present

## 2015-03-04 DIAGNOSIS — L601 Onycholysis: Secondary | ICD-10-CM | POA: Diagnosis not present

## 2015-03-04 DIAGNOSIS — M79606 Pain in leg, unspecified: Secondary | ICD-10-CM | POA: Diagnosis not present

## 2015-03-04 DIAGNOSIS — L603 Nail dystrophy: Secondary | ICD-10-CM | POA: Diagnosis not present

## 2015-03-04 DIAGNOSIS — L608 Other nail disorders: Secondary | ICD-10-CM | POA: Diagnosis not present

## 2015-03-04 NOTE — Progress Notes (Signed)
   Subjective:    Patient ID: Kristen Pena, female    DOB: 07/15/1945, 69 y.o.   MRN: [id number]  HPI    Review of Systems  Musculoskeletal: Positive for back pain and arthralgias.  Skin:       Change in nails  Allergic/Immunologic: Positive for food allergies.  All other systems reviewed and are negative.      Objective:   Physical Exam        Assessment & Plan:

## 2015-03-06 NOTE — Progress Notes (Signed)
Subjective:     Patient ID: Kristen Pena, female   DOB: [birth year], 69 y.o.   MRN: [id number]  HPI patient presents with thick yellow brittle nailbeds 1-5 both feet that are impossible for her to cut and they become painful. States it's been there for a while but she's also concerned about the discoloration and whether any treatment would be of benefit to her   Review of Systems  All other systems reviewed and are negative.      Objective:   Physical Exam  Constitutional: She is oriented to person, place, and time.  Cardiovascular: Intact distal pulses.   Musculoskeletal: Normal range of motion.  Neurological: She is oriented to person, place, and time.  Skin: Skin is warm and dry.  Nursing note and vitals reviewed.  neurovascular status found to be +1 over 4 PT +2 over 4 DP with neurological intact bilateral. Patient's found to have good digital perfusion is well oriented 3 with no equinus condition and I did note that there is adequate hair growth and moderate depression of the arch. Patient's noted to have thick yellow brittle nailbeds 1-5 both feet that are painful in the corners and impossible for her to cut with some crumbling around the hallux nails predominantly     Assessment:     Appears to be mycotic nail infection with atrophy of the nailbed ingrown component and probable hereditary component    Plan:     H&P and conditions reviewed with patient. Today debridement of nailbeds 1-5 both feet accomplished carefully taking out all corners to reduce pressure and ties on wider-type shoes and did do a culture of the nailbed to try to see whether or not fungal component is present which can be treated. Reappoint to recheck

## 2015-03-22 ENCOUNTER — Telehealth: Payer: Self-pay | Admitting: *Deleted

## 2015-03-22 NOTE — Telephone Encounter (Addendum)
Dr. Paulla Dolly reviewed fungal culture of 03/04/2015 as negative, may use [id number].  Left message to call for results.  Pt returned call.  Left message informing pt of orders.

## 2015-04-15 ENCOUNTER — Ambulatory Visit (INDEPENDENT_AMBULATORY_CARE_PROVIDER_SITE_OTHER): Payer: Medicare Other | Admitting: Podiatry

## 2015-04-15 ENCOUNTER — Encounter: Payer: Self-pay | Admitting: Podiatry

## 2015-04-15 VITALS — BP 138/81 | HR 56 | Resp 16

## 2015-04-15 DIAGNOSIS — B351 Tinea unguium: Secondary | ICD-10-CM | POA: Diagnosis not present

## 2015-04-15 DIAGNOSIS — M21619 Bunion of unspecified foot: Secondary | ICD-10-CM

## 2015-04-15 DIAGNOSIS — L603 Nail dystrophy: Secondary | ICD-10-CM | POA: Diagnosis not present

## 2015-04-16 DIAGNOSIS — Z23 Encounter for immunization: Secondary | ICD-10-CM | POA: Diagnosis not present

## 2015-04-16 DIAGNOSIS — M65312 Trigger thumb, left thumb: Secondary | ICD-10-CM | POA: Diagnosis not present

## 2015-04-17 NOTE — Progress Notes (Signed)
Subjective:     Patient ID: Kristen Pena, female   DOB: 11-29-1945, 69 y.o.   MRN: [id number]  HPI patient presents stating that there is no fungus but it does seem like her nails look a little bit better. Also concerned about a bunion right foot   Review of Systems     Objective:   Physical Exam Neurovascular status intact muscle strength adequate with nails that are thickened but they are not discolored currently and there is redness around the first metatarsal and right with mild deformity noted    Assessment:     Mycotic nail infection that did not show up on pathology along with structural bunion    Plan:     Revealed and reviewed the results of pathology and at this point I committed wider shoes and debridements to be accomplished. Reappoint as needed

## 2015-05-18 DIAGNOSIS — E0520 Thyrotoxicosis with toxic multinodular goiter without thyrotoxic crisis or storm: Secondary | ICD-10-CM | POA: Diagnosis not present

## 2015-05-18 DIAGNOSIS — M65312 Trigger thumb, left thumb: Secondary | ICD-10-CM | POA: Diagnosis not present

## 2015-05-25 DIAGNOSIS — Z6825 Body mass index (BMI) 25.0-25.9, adult: Secondary | ICD-10-CM | POA: Diagnosis not present

## 2015-05-25 DIAGNOSIS — E786 Lipoprotein deficiency: Secondary | ICD-10-CM | POA: Diagnosis not present

## 2015-05-25 DIAGNOSIS — M859 Disorder of bone density and structure, unspecified: Secondary | ICD-10-CM | POA: Diagnosis not present

## 2015-05-25 DIAGNOSIS — E0520 Thyrotoxicosis with toxic multinodular goiter without thyrotoxic crisis or storm: Secondary | ICD-10-CM | POA: Diagnosis not present

## 2015-05-25 DIAGNOSIS — E559 Vitamin D deficiency, unspecified: Secondary | ICD-10-CM | POA: Diagnosis not present

## 2015-05-25 DIAGNOSIS — R740 Nonspecific elevation of levels of transaminase and lactic acid dehydrogenase [LDH]: Secondary | ICD-10-CM | POA: Diagnosis not present

## 2015-05-25 DIAGNOSIS — E784 Other hyperlipidemia: Secondary | ICD-10-CM | POA: Diagnosis not present

## 2015-07-01 ENCOUNTER — Ambulatory Visit: Payer: Medicare Other | Admitting: Podiatry

## 2015-07-08 ENCOUNTER — Ambulatory Visit: Payer: Medicare Other | Admitting: Podiatry

## 2015-07-15 ENCOUNTER — Telehealth: Payer: Self-pay | Admitting: Gastroenterology

## 2015-07-15 NOTE — Telephone Encounter (Signed)
Patient with chest pain with meals.  She has tried OTC products with minimal relief.  She reports she is a little better this am.  She is scheduled to see Dr. Fuller Plan on 08/02/15 at 3:00

## 2015-07-18 DIAGNOSIS — M79674 Pain in right toe(s): Secondary | ICD-10-CM | POA: Diagnosis not present

## 2015-07-18 DIAGNOSIS — B351 Tinea unguium: Secondary | ICD-10-CM | POA: Diagnosis not present

## 2015-07-18 DIAGNOSIS — M79675 Pain in left toe(s): Secondary | ICD-10-CM | POA: Diagnosis not present

## 2015-07-18 DIAGNOSIS — R2689 Other abnormalities of gait and mobility: Secondary | ICD-10-CM | POA: Diagnosis not present

## 2015-07-28 ENCOUNTER — Encounter: Payer: Self-pay | Admitting: Gastroenterology

## 2015-08-01 DIAGNOSIS — M65312 Trigger thumb, left thumb: Secondary | ICD-10-CM | POA: Diagnosis not present

## 2015-08-02 ENCOUNTER — Encounter: Payer: Self-pay | Admitting: Gastroenterology

## 2015-08-02 ENCOUNTER — Ambulatory Visit (INDEPENDENT_AMBULATORY_CARE_PROVIDER_SITE_OTHER): Payer: Medicare Other | Admitting: Gastroenterology

## 2015-08-02 ENCOUNTER — Ambulatory Visit: Payer: PRIVATE HEALTH INSURANCE | Admitting: Gastroenterology

## 2015-08-02 VITALS — BP 120/68 | HR 72 | Ht 66.0 in | Wt 160.0 lb

## 2015-08-02 DIAGNOSIS — Z800 Family history of malignant neoplasm of digestive organs: Secondary | ICD-10-CM | POA: Diagnosis not present

## 2015-08-02 DIAGNOSIS — K219 Gastro-esophageal reflux disease without esophagitis: Secondary | ICD-10-CM

## 2015-08-02 MED ORDER — NA SULFATE-K SULFATE-MG SULF 17.5-3.13-1.6 GM/177ML PO SOLN: 1.0000 | Freq: Once | ORAL | Status: DC

## 2015-08-02 NOTE — Patient Instructions (Signed)
You have been scheduled for an endoscopy and colonoscopy. Please follow the written instructions given to you at your visit today. Please pick up your prep supplies at the pharmacy within the next 1-3 days. If you use inhalers (even only as needed), please bring them with you on the day of your procedure. Your physician has requested that you go to www.startemmi.com and enter the access code given to you at your visit today. This web site gives a general overview about your procedure. However, you should still follow specific instructions given to you by our office regarding your preparation for the procedure.  You can take over the counter Zantac 150 mg twice daily.   Thank you for choosing me and [facility].  Pricilla Riffle. Dagoberto Ligas., MD., Marval Regal  cc: Reynold Bowen, MD

## 2015-08-02 NOTE — Progress Notes (Signed)
    History of Present Illness: This is a 70 year old female referred by Kristen Bowen, MD for the evaluation of GERD. She has a history of GERD for about 20 years. She previously underwent EGD in 2000 showing esophageal erythema and EGD junction stricture. Her reflux has generally been diet controlled for the past several years however she notes a recent change in symptoms with slightly more active reflux symptoms following meals. Denies weight loss, abdominal pain, constipation, diarrhea, change in stool caliber, melena, hematochezia, nausea, vomiting, dysphagia, chest pain.  Review of Systems: Pertinent positive and negative review of systems were noted in the above HPI section. All other review of systems were otherwise negative.  Current Medications, Allergies, Past Medical History, Past Surgical History, Family History and Social History were reviewed in [employer] record.  Physical Exam: General: Well developed, well nourished, no acute distress Head: Normocephalic and atraumatic Eyes:  sclerae anicteric, EOMI Ears: Normal auditory acuity Mouth: No deformity or lesions Neck: Supple, no masses or thyromegaly Lungs: Clear throughout to auscultation Heart: Regular rate and rhythm; no murmurs, rubs or bruits Abdomen: Soft, non tender and non distended. No masses, hepatosplenomegaly or hernias noted. Normal Bowel sounds Rectal: deferred to colonoscopy Musculoskeletal: Symmetrical with no gross deformities  Skin: No lesions on visible extremities Pulses:  Normal pulses noted Extremities: No clubbing, cyanosis, edema or deformities noted Neurological: Alert oriented x 4, grossly nonfocal Cervical Nodes:  No significant cervical adenopathy Inguinal Nodes: No significant inguinal adenopathy Psychological:  Alert and cooperative. Normal mood and affect  Assessment and Recommendations:  1. GERD. Recent flare. Intensify antireflux measures. Begin ranitidine 150 mg  twice daily as needed and Tums when necessary. Rule out esophagitis and Barrett's esophagus. Schedule EGD. The risks (including bleeding, perforation, infection, missed lesions, medication reactions and possible hospitalization or surgery if complications occur), benefits, and alternatives to endoscopy with possible biopsy and possible dilation were discussed with the patient and they consent to proceed.   2. Family history of colon cancer. She is due for a five-year interval surveillance colonoscopy. The risks (including bleeding, perforation, infection, missed lesions, medication reactions and possible hospitalization or surgery if complications occur), benefits, and alternatives to colonoscopy with possible biopsy and possible polypectomy were discussed with the patient and they consent to proceed.    cc: Kristen Bowen, MD [address] [place], [place] [address]

## 2015-09-05 DIAGNOSIS — M65312 Trigger thumb, left thumb: Secondary | ICD-10-CM | POA: Diagnosis not present

## 2015-09-27 ENCOUNTER — Ambulatory Visit (AMBULATORY_SURGERY_CENTER): Payer: Medicare Other | Admitting: Gastroenterology

## 2015-09-27 ENCOUNTER — Encounter: Payer: Self-pay | Admitting: Gastroenterology

## 2015-09-27 VITALS — BP 118/61 | HR 64 | Temp 99.6°F | Resp 13 | Ht 66.0 in | Wt 160.0 lb

## 2015-09-27 DIAGNOSIS — D124 Benign neoplasm of descending colon: Secondary | ICD-10-CM

## 2015-09-27 DIAGNOSIS — I10 Essential (primary) hypertension: Secondary | ICD-10-CM | POA: Diagnosis not present

## 2015-09-27 DIAGNOSIS — K219 Gastro-esophageal reflux disease without esophagitis: Secondary | ICD-10-CM

## 2015-09-27 DIAGNOSIS — K317 Polyp of stomach and duodenum: Secondary | ICD-10-CM | POA: Diagnosis not present

## 2015-09-27 DIAGNOSIS — Z1211 Encounter for screening for malignant neoplasm of colon: Secondary | ICD-10-CM | POA: Diagnosis not present

## 2015-09-27 DIAGNOSIS — Z800 Family history of malignant neoplasm of digestive organs: Secondary | ICD-10-CM | POA: Diagnosis not present

## 2015-09-27 MED ORDER — SODIUM CHLORIDE 0.9 % IV SOLN: 500.0000 mL | INTRAVENOUS | Status: DC

## 2015-09-27 NOTE — Op Note (Signed)
[place] Patient Name: Luther Mosher Procedure Date: 09/27/2015 10:31 AM MRN: [id number] Endoscopist: Ladene Artist MD, MD Age: 70 Date of Birth: 05-Apr-1946 Gender: Female Procedure:                Colonoscopy Indications:              Screening in patient at increased risk: Family                            history of 1st-degree relative with colorectal                            cancer Medicines:                Monitored Anesthesia Care Procedure:                Pre-Anesthesia Assessment:                           - Prior to the procedure, a History and Physical                            was performed, and patient medications and                            allergies were reviewed. The patient's tolerance of                            previous anesthesia was also reviewed. The risks                            and benefits of the procedure and the sedation                            options and risks were discussed with the patient.                            All questions were answered, and informed consent                            was obtained. Prior Anticoagulants: The patient has                            taken no previous anticoagulant or antiplatelet                            agents. ASA Grade Assessment: II - A patient with                            mild systemic disease. After reviewing the risks                            and benefits, the patient was deemed in  satisfactory condition to undergo the procedure.                           After obtaining informed consent, the colonoscope                            was passed under direct vision. Throughout the                            procedure, the patient's blood pressure, pulse, and                            oxygen saturations were monitored continuously. The                            Model PCF-H190L [id number]) scope was introduced                            through the  anus and advanced to the the cecum,                            identified by appendiceal orifice and ileocecal                            valve. The colonoscopy was performed without                            difficulty. The patient tolerated the procedure                            well. The quality of the bowel preparation was                            excellent. The ileocecal valve, appendiceal                            orifice, and rectum were photographed. Scope In: 10:53:15 AM Scope Out: [id number] AM Scope Withdrawal Time: 0 hours 9 minutes 12 seconds  Total Procedure Duration: 0 hours 12 minutes 56 seconds  Findings:                 The digital rectal exam was normal.                           A 5 mm polyp was found in the descending colon. The                            polyp was sessile. The polyp was removed with a                            cold biopsy forceps. Resection and retrieval were                            complete.  The exam was otherwise normal throughout the                            examined colon.                           The retroflexed view of the distal rectum and anal                            verge was normal and showed no anal or rectal                            abnormalities. Complications:            No immediate complications. Estimated Blood Loss:     Estimated blood loss: none. Impression:               - One 5 mm polyp in the descending colon, removed                            with a cold biopsy forceps. Resected and retrieved. Recommendation:           - Patient has a contact number available for                            emergencies. The signs and symptoms of potential                            delayed complications were discussed with the                            patient. Return to normal activities tomorrow.                            Written discharge instructions were provided to the                             patient.                           - Resume previous diet.                           - Continue present medications.                           - Await pathology results.                           - Repeat colonoscopy in 5 years. Ladene Artist MD, MD 09/27/2015 11:15:07 AM This report has been signed electronically.

## 2015-09-27 NOTE — Progress Notes (Signed)
No problems noted in the recovery room. maw 

## 2015-09-27 NOTE — Progress Notes (Signed)
Called to room to assist during endoscopic procedure.  Patient ID and intended procedure confirmed with present staff. Received instructions for my participation in the procedure from the performing physician.  

## 2015-09-27 NOTE — Progress Notes (Signed)
Report to PACU, RN, vss, BBS= Clear.  

## 2015-09-27 NOTE — Op Note (Signed)
[facility] Patient Name: Kristen Pena Procedure Date: 09/27/2015 11:08 AM MRN: [id number] Endoscopist: Ladene Artist MD, MD Age: 70 Date of Birth: 1945/09/21 Gender: Female Procedure:                Upper GI endoscopy Indications:              Gastro-esophageal reflux disease Medicines:                Monitored Anesthesia Care Procedure:                Pre-Anesthesia Assessment:                           - Prior to the procedure, a History and Physical                            was performed, and patient medications and                            allergies were reviewed. The patient's tolerance of                            previous anesthesia was also reviewed. The risks                            and benefits of the procedure and the sedation                            options and risks were discussed with the patient.                            All questions were answered, and informed consent                            was obtained. Prior Anticoagulants: The patient has                            taken no previous anticoagulant or antiplatelet                            agents. ASA Grade Assessment: II - A patient with                            mild systemic disease. After reviewing the risks                            and benefits, the patient was deemed in                            satisfactory condition to undergo the procedure.                           After obtaining informed consent, the endoscope was  passed under direct vision. Throughout the                            procedure, the patient's blood pressure, pulse, and                            oxygen saturations were monitored continuously. The                            Model GIF-[id number]) scope was introduced                            through the mouth, and advanced to the second part                            of duodenum. The upper GI endoscopy was                 accomplished without difficulty. The patient                            tolerated the procedure well. Scope In: Scope Out: Findings:                 Two mild benign-appearing, intrinsic stenoses were                            found in the distal esophagus. And they were                            traversed.                           The exam of the esophagus was otherwise normal.                           A few 3 to 4 mm sessile polyps with no bleeding and                            no stigmata of recent bleeding were found in the                            gastric body. Biopsies were taken with a cold                            forceps for histology.                           A small hiatal hernia was present on retroflexed.                           The exam of the stomach was otherwise normal.                           The cardia and gastric fundus were normal on  retroflexion.                           The duodenal bulb and second portion of the                            duodenum were normal. Complications:            No immediate complications. Estimated Blood Loss:     Estimated blood loss: none. Impression:               - Benign-appearing esophageal stenoses.                           - A few gastric polyps. Biopsied.                           - Small hiatal hernia. Recommendation:           - Patient has a contact number available for                            emergencies. The signs and symptoms of potential                            delayed complications were discussed with the                            patient. Return to normal activities tomorrow.                            Written discharge instructions were provided to the                            patient.                           - Resume previous diet with antireflux measures.                           - Continue present medications.                           - Await  pathology results. Ladene Artist MD, MD 09/27/2015 11:25:23 AM This report has been signed electronically.

## 2015-09-27 NOTE — Patient Instructions (Signed)

## 2015-09-28 ENCOUNTER — Telehealth: Payer: Self-pay

## 2015-09-28 DIAGNOSIS — E0520 Thyrotoxicosis with toxic multinodular goiter without thyrotoxic crisis or storm: Secondary | ICD-10-CM | POA: Diagnosis not present

## 2015-09-28 NOTE — Telephone Encounter (Signed)
  Follow up Call-  Call back number 09/27/2015  Post procedure Call Back phone  # [phone]  Permission to leave phone message Yes     Patient questions:  Do you have a fever, pain , or abdominal swelling? No. Pain Score  0 *  Have you tolerated food without any problems? Yes.    Have you been able to return to your normal activities? Yes.    Do you have any questions about your discharge instructions: Diet   No. Medications  No. Follow up visit  No.  Do you have questions or concerns about your Care? No.  Actions: * If pain score is 4 or above: No action needed, pain <4.   No problems per the pt. maw

## 2015-10-05 ENCOUNTER — Encounter: Payer: Self-pay | Admitting: Gastroenterology

## 2015-10-05 DIAGNOSIS — E559 Vitamin D deficiency, unspecified: Secondary | ICD-10-CM | POA: Diagnosis not present

## 2015-10-05 DIAGNOSIS — M859 Disorder of bone density and structure, unspecified: Secondary | ICD-10-CM | POA: Diagnosis not present

## 2015-10-05 DIAGNOSIS — D126 Benign neoplasm of colon, unspecified: Secondary | ICD-10-CM | POA: Diagnosis not present

## 2015-10-05 DIAGNOSIS — E786 Lipoprotein deficiency: Secondary | ICD-10-CM | POA: Diagnosis not present

## 2015-10-05 DIAGNOSIS — E0520 Thyrotoxicosis with toxic multinodular goiter without thyrotoxic crisis or storm: Secondary | ICD-10-CM | POA: Diagnosis not present

## 2015-10-05 DIAGNOSIS — E785 Hyperlipidemia, unspecified: Secondary | ICD-10-CM | POA: Diagnosis not present

## 2015-10-05 DIAGNOSIS — Z6825 Body mass index (BMI) 25.0-25.9, adult: Secondary | ICD-10-CM | POA: Diagnosis not present

## 2015-10-05 DIAGNOSIS — R740 Nonspecific elevation of levels of transaminase and lactic acid dehydrogenase [LDH]: Secondary | ICD-10-CM | POA: Diagnosis not present

## 2015-10-05 DIAGNOSIS — Z1389 Encounter for screening for other disorder: Secondary | ICD-10-CM | POA: Diagnosis not present

## 2015-10-20 DIAGNOSIS — H2513 Age-related nuclear cataract, bilateral: Secondary | ICD-10-CM | POA: Diagnosis not present

## 2016-03-22 DIAGNOSIS — Z1289 Encounter for screening for malignant neoplasm of other sites: Secondary | ICD-10-CM | POA: Diagnosis not present

## 2016-03-26 DIAGNOSIS — M5442 Lumbago with sciatica, left side: Secondary | ICD-10-CM | POA: Diagnosis not present

## 2016-03-26 DIAGNOSIS — M25552 Pain in left hip: Secondary | ICD-10-CM | POA: Diagnosis not present

## 2016-03-30 DIAGNOSIS — Z1231 Encounter for screening mammogram for malignant neoplasm of breast: Secondary | ICD-10-CM | POA: Diagnosis not present

## 2016-03-30 DIAGNOSIS — E0520 Thyrotoxicosis with toxic multinodular goiter without thyrotoxic crisis or storm: Secondary | ICD-10-CM | POA: Diagnosis not present

## 2016-04-05 DIAGNOSIS — M7541 Impingement syndrome of right shoulder: Secondary | ICD-10-CM | POA: Diagnosis not present

## 2016-04-18 DIAGNOSIS — M5442 Lumbago with sciatica, left side: Secondary | ICD-10-CM | POA: Diagnosis not present

## 2016-04-18 DIAGNOSIS — M25552 Pain in left hip: Secondary | ICD-10-CM | POA: Diagnosis not present

## 2016-04-25 DIAGNOSIS — E0520 Thyrotoxicosis with toxic multinodular goiter without thyrotoxic crisis or storm: Secondary | ICD-10-CM | POA: Diagnosis not present

## 2016-04-25 DIAGNOSIS — E559 Vitamin D deficiency, unspecified: Secondary | ICD-10-CM | POA: Diagnosis not present

## 2016-05-01 DIAGNOSIS — Z23 Encounter for immunization: Secondary | ICD-10-CM | POA: Diagnosis not present

## 2016-05-01 DIAGNOSIS — D126 Benign neoplasm of colon, unspecified: Secondary | ICD-10-CM | POA: Diagnosis not present

## 2016-05-01 DIAGNOSIS — E784 Other hyperlipidemia: Secondary | ICD-10-CM | POA: Diagnosis not present

## 2016-05-01 DIAGNOSIS — E559 Vitamin D deficiency, unspecified: Secondary | ICD-10-CM | POA: Diagnosis not present

## 2016-05-01 DIAGNOSIS — M859 Disorder of bone density and structure, unspecified: Secondary | ICD-10-CM | POA: Diagnosis not present

## 2016-05-01 DIAGNOSIS — R740 Nonspecific elevation of levels of transaminase and lactic acid dehydrogenase [LDH]: Secondary | ICD-10-CM | POA: Diagnosis not present

## 2016-05-01 DIAGNOSIS — E0520 Thyrotoxicosis with toxic multinodular goiter without thyrotoxic crisis or storm: Secondary | ICD-10-CM | POA: Diagnosis not present

## 2016-05-01 DIAGNOSIS — Z6824 Body mass index (BMI) 24.0-24.9, adult: Secondary | ICD-10-CM | POA: Diagnosis not present

## 2016-05-03 DIAGNOSIS — H43812 Vitreous degeneration, left eye: Secondary | ICD-10-CM | POA: Diagnosis not present

## 2016-05-03 DIAGNOSIS — H43392 Other vitreous opacities, left eye: Secondary | ICD-10-CM | POA: Diagnosis not present

## 2016-05-03 DIAGNOSIS — H2513 Age-related nuclear cataract, bilateral: Secondary | ICD-10-CM | POA: Diagnosis not present

## 2016-05-03 DIAGNOSIS — H25013 Cortical age-related cataract, bilateral: Secondary | ICD-10-CM | POA: Diagnosis not present

## 2016-05-04 DIAGNOSIS — M5442 Lumbago with sciatica, left side: Secondary | ICD-10-CM | POA: Diagnosis not present

## 2016-05-16 DIAGNOSIS — M5442 Lumbago with sciatica, left side: Secondary | ICD-10-CM | POA: Diagnosis not present

## 2016-05-23 DIAGNOSIS — M5442 Lumbago with sciatica, left side: Secondary | ICD-10-CM | POA: Diagnosis not present

## 2016-05-28 DIAGNOSIS — M1712 Unilateral primary osteoarthritis, left knee: Secondary | ICD-10-CM | POA: Diagnosis not present

## 2016-05-30 DIAGNOSIS — M5442 Lumbago with sciatica, left side: Secondary | ICD-10-CM | POA: Diagnosis not present

## 2016-06-06 DIAGNOSIS — M5442 Lumbago with sciatica, left side: Secondary | ICD-10-CM | POA: Diagnosis not present

## 2016-06-15 DIAGNOSIS — H43812 Vitreous degeneration, left eye: Secondary | ICD-10-CM | POA: Diagnosis not present

## 2016-06-15 DIAGNOSIS — H43392 Other vitreous opacities, left eye: Secondary | ICD-10-CM | POA: Diagnosis not present

## 2016-06-27 DIAGNOSIS — M5442 Lumbago with sciatica, left side: Secondary | ICD-10-CM | POA: Diagnosis not present

## 2016-07-02 DIAGNOSIS — M1712 Unilateral primary osteoarthritis, left knee: Secondary | ICD-10-CM | POA: Diagnosis not present

## 2016-07-10 DIAGNOSIS — E559 Vitamin D deficiency, unspecified: Secondary | ICD-10-CM | POA: Diagnosis not present

## 2016-07-10 DIAGNOSIS — E0520 Thyrotoxicosis with toxic multinodular goiter without thyrotoxic crisis or storm: Secondary | ICD-10-CM | POA: Diagnosis not present

## 2016-07-11 DIAGNOSIS — M1712 Unilateral primary osteoarthritis, left knee: Secondary | ICD-10-CM | POA: Diagnosis not present

## 2016-07-18 DIAGNOSIS — M1712 Unilateral primary osteoarthritis, left knee: Secondary | ICD-10-CM | POA: Diagnosis not present

## 2016-08-06 ENCOUNTER — Other Ambulatory Visit: Payer: Self-pay | Admitting: Orthopedic Surgery

## 2016-08-06 DIAGNOSIS — M1712 Unilateral primary osteoarthritis, left knee: Secondary | ICD-10-CM

## 2016-08-12 ENCOUNTER — Ambulatory Visit
Admission: RE | Admit: 2016-08-12 | Discharge: 2016-08-12 | Disposition: A | Discharge status: Home / Self Care | Payer: Medicare Other | Source: Ambulatory Visit | Attending: Orthopedic Surgery | Admitting: Orthopedic Surgery

## 2016-08-12 DIAGNOSIS — M1712 Unilateral primary osteoarthritis, left knee: Secondary | ICD-10-CM

## 2016-08-12 DIAGNOSIS — S83242A Other tear of medial meniscus, current injury, left knee, initial encounter: Secondary | ICD-10-CM | POA: Diagnosis not present

## 2016-08-13 ENCOUNTER — Encounter: Payer: Self-pay | Admitting: Neurology

## 2016-08-13 ENCOUNTER — Ambulatory Visit (INDEPENDENT_AMBULATORY_CARE_PROVIDER_SITE_OTHER): Payer: Medicare Other | Admitting: Neurology

## 2016-08-13 VITALS — BP 136/81 | HR 69 | Resp 20 | Ht 66.0 in | Wt 150.0 lb

## 2016-08-13 DIAGNOSIS — R351 Nocturia: Secondary | ICD-10-CM

## 2016-08-13 DIAGNOSIS — R0683 Snoring: Secondary | ICD-10-CM | POA: Diagnosis not present

## 2016-08-13 DIAGNOSIS — G4726 Circadian rhythm sleep disorder, shift work type: Secondary | ICD-10-CM

## 2016-08-13 DIAGNOSIS — R002 Palpitations: Secondary | ICD-10-CM

## 2016-08-13 DIAGNOSIS — I4510 Unspecified right bundle-branch block: Secondary | ICD-10-CM

## 2016-08-13 NOTE — Patient Instructions (Signed)

## 2016-08-13 NOTE — Progress Notes (Signed)
Subjective:    Patient ID: Kristen Pena is a 71 y.o. female.  HPI     Kristen Age, MD, PhD [facility] [address] [place], [place] [address]  Dear Dr. Forde Pena,   I saw your patient, Kristen Pena, upon your kind request in my neurologic clinic today for initial consultation of her sleep disorder, in particular, concern for underlying obstructive sleep apnea. The patient is unaccompanied today. As you know, Kristen Pena is a very pleasant 71 year old right-handed woman with an underlying medical history of toxic multinodular goiter, treated in 1992 with radioactive iodine, osteopenia, vitamin D deficiency, right bundle branch block, and reflux disease and L knee pain, who reports snoring andDifficulty with sleep maintenance in the context of shift work. She also has some nonrestorative sleep. I reviewed your office note from 05/01/2016, which you kindly included. Her Epworth sleepiness score is 5 out of 24, her fatigue score is 9 out of 63 today. She lives alone, has been divorced for over 34 years, and has recently been told by her daughter who was visiting from [place] that she snores. She has 2 grown children, 2 daughters, one in [place], and one in [place]. She is a nonsmoker and does not drink alcohol, denies caffeine use. She has L knee pain for years, has been going to [facility], is s/p steroid injection and 3 rounds of injections.  she denies any telltale symptoms of restless leg syndrome or leg twitching at night.  She has been working as a Warehouse manager for years. She used to work in [place] for 4 years and moved back to [place] in September 2017, started working and earlier shift from 5 AM on words and slept really poorly. She is used to second shift work but still has some problems maintaining sleep at times, she works from 4 PM to 7 PM 5 days a week, Sundays through Thursdays. She has a bedtime around 11 and wake up time is  5 or 6 AM. She usually wakes up spontaneously without an alarm. As a child she was not a great sleeper and did not require as much sleep as her friends or family members. She has recently had a MRI of her left knee. She is awaiting test results. She wakes up with a dry mouth, she has water by her bedside. Of note, she has a history of palpitations and has a diagnosis of a right bundle branch block.  Her Past Medical History Is Significant For: Past Medical History:  Diagnosis Date  . Thyroid disease     Her Past Surgical History Is Significant For: Past Surgical History:  Procedure Laterality Date  . ABDOMINAL HYSTERECTOMY      Her Family History Is Significant For: Family History  Problem Relation Pena of Onset  . Colon cancer Mother 74  . Stomach cancer Father     Her Social History Is Significant For: Social History   Social History  . Marital status: Single    Spouse name: N/A  . Number of children: 2  . Years of education: N/A   Occupational History  . Restaurant    Social History Main Topics  . Smoking status: Never Smoker  . Smokeless tobacco: Never Used  . Alcohol use 0.0 oz/week  . Drug use: No  . Sexual activity: Not Asked   Other Topics Concern  . None   Social History Narrative  . None    Her Allergies Are:  No Known  Allergies:   Her Current Medications Are:  Outpatient Encounter Prescriptions as of 08/13/2016  Medication Sig  . Cholecalciferol (VITAMIN D PO) Take by mouth.  . Fexofenadine HCl (ALLEGRA PO) Take by mouth.  . hydrochlorothiazide (MICROZIDE) 12.5 MG capsule   . montelukast (SINGULAIR) 10 MG tablet   . rosuvastatin (CRESTOR) 10 MG tablet Take 10 mg by mouth 2 (two) times a week.   . Vitamin D, Ergocalciferol, (DRISDOL) 50000 units CAPS capsule Take 1 capsule by mouth 3 (three) times a week.   No facility-administered encounter medications on file as of 08/13/2016.   :  Review of Systems:  Out of a complete 14 point review of  systems, all are reviewed and negative with the exception of these symptoms as listed below: Review of Systems  Neurological:       Pt presents today to discuss her snoring. Her daughter reported to her that she snores but pt recorded herself one night and did not catch any snoring.  Epworth Sleepiness Scale 0= would never doze 1= slight chance of dozing 2= moderate chance of dozing 3= high chance of dozing  Sitting and reading: 1 Watching TV: 1 Sitting inactive in a public place (ex. Theater or meeting): 0 As a passenger in a car for an hour without a break: 1 Lying down to rest in the afternoon: 2 Sitting and talking to someone: 0 Sitting quietly after lunch (no alcohol):0 In a car, while stopped in traffic: 0 Total: 5     Objective:  Neurologic Exam  Physical Exam Physical Examination:   Vitals:   08/13/16 1112  BP: 136/81  Pulse: 69  Resp: 20    General Examination: The patient is a very pleasant 71 y.o. female in no acute distress. She appears well-developed and well-nourished and very well groomed.   HEENT: Normocephalic, atraumatic, pupils are equal, round and reactive to light and accommodation. Funduscopic exam is normal with sharp disc margins noted. Extraocular tracking is good without limitation to gaze excursion or nystagmus noted. Normal smooth pursuit is noted. Hearing is grossly intact. Tympanic membranes are clear bilaterally. Face is symmetric with normal facial animation and normal facial sensation. Speech is clear with no dysarthria noted. There is no hypophonia. There is no lip, neck/head, jaw or voice tremor. Neck is supple with full range of passive and active motion. There are no carotid bruits on auscultation. Oropharynx exam reveals: mild mouth dryness, adequate dental hygiene and moderate airway crowding, due to smaller airway entry, larger uvula and larger tongue, redundant soft palate. Mallampati is class II. Tongue protrudes centrally and palate  elevates symmetrically. Tonsils are small. Neck size is 13.25 inches. She has no overbite and mild malalignment. Nasal inspection reveals no significant nasal mucosal bogginess or redness and no septal deviation.   Chest: Clear to auscultation without wheezing, rhonchi or crackles noted.  Heart: S1+S2+0, regular and normal without murmurs, rubs or gallops noted.   Abdomen: Soft, non-tender and non-distended with normal bowel sounds appreciated on auscultation.  Extremities: There is no pitting edema in the distal lower extremities bilaterally. Pedal pulses are intact.  Skin: Warm and dry without trophic changes noted.  Musculoskeletal: exam reveals no obvious joint deformities, tenderness or joint swelling or erythema.   Neurologically:  Mental status: The patient is awake, alert and oriented in all 4 spheres. Her immediate and remote memory, attention, language skills and fund of knowledge are appropriate. There is no evidence of aphasia, agnosia, apraxia or anomia. Speech is clear with  normal prosody and enunciation. Thought process is linear. Mood is normal and affect is normal.  Cranial nerves II - XII are as described above under HEENT exam. In addition: shoulder shrug is normal with equal shoulder height noted. Motor exam: Normal bulk, strength and tone is noted. There is no drift, tremor or rebound. Romberg is negative. Reflexes are 1+ throughout. Fine motor skills and coordination: intact with normal finger taps, normal hand movements, normal rapid alternating patting, normal foot taps and normal foot agility.  Cerebellar testing: No dysmetria or intention tremor on finger to nose testing. Heel to shin is unremarkable bilaterally. There is no truncal or gait ataxia.  Sensory exam: intact to light touch, pinprick, vibration, temperature sense and  proprioception in the upper and lower extremities.  Gait, station and balance: She stands easily. No veering to one side is noted. No leaning to  one side is noted. Posture is Pena-appropriate and stance is narrow based. Gait shows normal stride length and normal pace. No problems turning are noted. Tandem walk is unremarkable.   Assessment and Plan:  In summary, CHEMIKA VESELY is a very pleasant 71 y.o.-year old female with an underlying medical history of toxic multinodular goiter, treated in 1992 with radioactive iodine, osteopenia, vitamin D deficiency, right bundle branch block, and reflux disease and L knee pain, whose history and physical exam are concerning for underlying obstructive sleep apnea and confounding difficulty secondary to shift work sleep disorder.  I had a long chat with the patient about my findings and the diagnosis of OSA, its prognosis and treatment options. We talked about medical treatments, surgical interventions and non-pharmacological approaches. I explained in particular the risks and ramifications of untreated moderate to severe OSA, especially with respect to developing cardiovascular disease down the Road, including congestive heart failure, difficult to treat hypertension, cardiac arrhythmias, or stroke. Even type 2 diabetes has, in part, been linked to untreated OSA. Symptoms of untreated OSA include daytime sleepiness, memory problems, mood irritability and mood disorder such as depression and anxiety, lack of energy, as well as recurrent headaches, especially morning headaches. We talked about trying to maintain a healthy lifestyle in general, as well as the importance of weight control. I encouraged the patient to eat healthy, exercise daily and keep well hydrated, to keep a scheduled bedtime and wake time routine, to not skip any meals and eat healthy snacks in between meals. I advised the patient not to drive when feeling sleepy. I recommended the following at this time: sleep study with potential positive airway pressure titration. (We will score hypopneas at 4%).   I explained the sleep test procedure to  the patient and also outlined possible surgical and non-surgical treatment options of OSA, including the use of a custom-made dental device (which would require a referral to a specialist dentist or oral surgeon, but she lacks an overbite), upper airway surgical options, such as pillar implants, radiofrequency surgery, tongue base surgery, and UPPP (which would involve a referral to an ENT surgeon). Rarely, jaw surgery such as mandibular advancement may be considered.  I also explained the CPAP treatment option to the patient, who indicated that she would be willing to try CPAP if the need arises. I explained the importance of being compliant with PAP treatment, not only for insurance purposes but primarily to improve Her symptoms, and for the patient's long term health benefit, including to reduce Her cardiovascular risks. I answered all her questions today and the patient  was in agreement. I  would like to see her back after the sleep study is completed and encouraged her to call with any interim questions, concerns, problems or updates.   Thank you very much for allowing me to participate in the care of this nice patient. If I can be of any further assistance to you please do not hesitate to call me at [phone].  Sincerely,   Kristen Age, MD, PhD

## 2016-08-20 DIAGNOSIS — R0683 Snoring: Secondary | ICD-10-CM | POA: Diagnosis not present

## 2016-08-20 DIAGNOSIS — E0520 Thyrotoxicosis with toxic multinodular goiter without thyrotoxic crisis or storm: Secondary | ICD-10-CM | POA: Diagnosis not present

## 2016-08-20 DIAGNOSIS — E784 Other hyperlipidemia: Secondary | ICD-10-CM | POA: Diagnosis not present

## 2016-08-20 DIAGNOSIS — Z1389 Encounter for screening for other disorder: Secondary | ICD-10-CM | POA: Diagnosis not present

## 2016-08-20 DIAGNOSIS — Z6824 Body mass index (BMI) 24.0-24.9, adult: Secondary | ICD-10-CM | POA: Diagnosis not present

## 2016-08-20 DIAGNOSIS — M23304 Other meniscus derangements, unspecified medial meniscus, left knee: Secondary | ICD-10-CM | POA: Diagnosis not present

## 2016-08-20 DIAGNOSIS — D126 Benign neoplasm of colon, unspecified: Secondary | ICD-10-CM | POA: Diagnosis not present

## 2016-08-20 DIAGNOSIS — E559 Vitamin D deficiency, unspecified: Secondary | ICD-10-CM | POA: Diagnosis not present

## 2016-08-22 DIAGNOSIS — M1712 Unilateral primary osteoarthritis, left knee: Secondary | ICD-10-CM | POA: Diagnosis not present

## 2016-08-23 ENCOUNTER — Ambulatory Visit (INDEPENDENT_AMBULATORY_CARE_PROVIDER_SITE_OTHER): Payer: Medicare Other | Admitting: Neurology

## 2016-08-23 DIAGNOSIS — G4733 Obstructive sleep apnea (adult) (pediatric): Secondary | ICD-10-CM

## 2016-08-23 DIAGNOSIS — R9431 Abnormal electrocardiogram [ECG] [EKG]: Secondary | ICD-10-CM

## 2016-08-23 DIAGNOSIS — G4720 Circadian rhythm sleep disorder, unspecified type: Secondary | ICD-10-CM

## 2016-08-24 DIAGNOSIS — M1712 Unilateral primary osteoarthritis, left knee: Secondary | ICD-10-CM | POA: Diagnosis not present

## 2016-08-24 NOTE — Progress Notes (Signed)
Patient referred by Dr. Forde Dandy, seen by me on 08/13/16, diagnostic PSG on 08/23/16.   Please call and notify the patient that the recent sleep study did not show any significant obstructive sleep apnea, only mild OSA during dream/REM sleep. Did not sleep very well and not very much. Please inform patient that I we can go over the details of the study during a follow up appointment. Arrange a followup appointment. Also, route or fax report to PCP and referring MD, if other than PCP.  Once you have spoken to patient, you can close this encounter.   Thanks,  Star Age, MD, PhD [facility]

## 2016-08-24 NOTE — Procedures (Signed)
PATIENT'S NAME:  Kristen Pena, Kristen Pena DOB:      [birth year]      MR#:    [id number]     DATE OF RECORDING: 08/23/2016 REFERRING M.D.:  Reynold Bowen, MD Study Performed:   Baseline Polysomnogram HISTORY: 71 year old woman with a history of toxic multinodular goiter, treated in 1992 with radioactive iodine, osteopenia, vitamin D deficiency, right bundle branch block, and reflux disease and L knee pain, who reports snoring and difficulty with sleep maintenance in the context of shift work. The patient endorsed the Epworth Sleepiness Scale at 5 points. The patient's weight 150 pounds with a height of 66 (inches), resulting in a BMI of 24.1 kg/m2. The patient's neck circumference measured 13.25 inches.  CURRENT MEDICATIONS: Allegra, Microzide, Crestor, Singulair   PROCEDURE:  This is a multichannel digital polysomnogram utilizing the Somnostar 11.2 system.  Electrodes and sensors were applied and monitored per AASM Specifications.   EEG, EOG, Chin and Limb EMG, were sampled at 200 Hz.  ECG, Snore and Nasal Pressure, Thermal Airflow, Respiratory Effort, CPAP Flow and Pressure, Oximetry was sampled at 50 Hz. Digital video and audio were recorded.      BASELINE STUDY  Lights Out was at 22:50 and Lights On at 04:59.  Total recording time (TRT) was 369.5 minutes, with a total sleep time (TST) of  209.5 minutes. The patient's sleep latency was 28.5 minutes, which is prolonged.  REM latency was 55 minutes, which is mildly reduced.  The sleep efficiency was 56.7 %, which is reduced.     SLEEP ARCHITECTURE: WASO (Wake after sleep onset) was 114.5 minutes with moderate sleep fragmentation noted and inability to go back to sleep after 03:04 AM.  There were 14.5 minutes in Stage N1, 95.5 minutes Stage N2, 74.5 minutes Stage N3 and 25 minutes in Stage REM.  The percentage of Stage N1 was 6.9%, Stage N2 was 45.6%, which is normal, Stage N3 was 35.6%, which is increased, and Stage R (REM sleep) was 11.9%, which is reduced.    The arousals were noted as: 23 were spontaneous, 0 were associated with PLMs, 1 were associated with respiratory events.  The patient took no bathroom breaks. No significant snoring was noted. The EKG showed occasional PVCs and was otherwise in keeping with normal sinus rhythm (NSR).  RESPIRATORY ANALYSIS:  There were a total of 5 respiratory events:  1 obstructive apneas, 0 central apneas and 0 mixed apneas with a total of 1 apneas and an apnea index (AI) of .3 /hour. There were 4 hypopneas with a hypopnea index of 1.1 /hour. The patient also had 0 respiratory event related arousals (RERAs).      The total APNEA/HYPOPNEA INDEX (AHI) was 1.4/hour and the total RESPIRATORY DISTURBANCE INDEX was 1.4 /hour.  5 events occurred in REM sleep and 0 events in NREM. The REM AHI was 12 /hour, versus a non-REM AHI of 0. The patient spent 209.5 minutes of total sleep time in the supine position and 0 minutes in non-supine.. The supine AHI was 1.4 versus a non-supine AHI of 0.0.  OXYGEN SATURATION & C02:  The Wake baseline 02 saturation was 98%, with the lowest being 88%. Time spent below 89% saturation equaled 0 minutes.  PERIODIC LIMB MOVEMENTS: The patient had a total of 0 Periodic Limb Movements.  The Periodic Limb Movement (PLM) index was 0 and the PLM Arousal index was 0/hour. Post-study, the patient indicated that sleep was worse than usual.  IMPRESSION:  1. Obstructive Sleep Apnea (OSA), mild REM  related 2. Abnormal nonspecific EKG 3. Dysfunctions associated with sleep stages or arousal from sleep  RECOMMENDATIONS:  1. This study demonstrates an overall normal AHI of 1.4/hour, and evidence of mild obstructive sleep apnea during REM sleep, but no significant snoring and no significant desaturations. Generally speaking, treatment options may include avoidance of supine sleep position along with some weight loss, upper airway or jaw surgery in selected patients or the use of an oral appliance in  certain patients. ENT evaluation and/or consultation with a maxillofacial surgeon or dentist may be feasible in some instances.    2. The study showed occasional PVCs on single lead EKG; clinical correlation is recommended and consultation with cardiology may be feasible.  3. This study shows sleep fragmentation and abnormal sleep stage percentages; these are nonspecific findings and per se do not signify an intrinsic sleep disorder or a cause for the patient's sleep-related symptoms. Causes include (but are not limited to) the first night effect of the sleep study, circadian rhythm disturbances, medication effect or an underlying mood disorder or medical problem.  4. The patient should be cautioned not to drive, work at heights, or operate dangerous or heavy equipment when tired or sleepy. Review and reiteration of good sleep hygiene measures should be pursued with any patient. 5. The patient will be seen in follow-up by Dr. Rexene Alberts at [facility] for discussion of the test results and further management strategies. The referring provider will be notified of the test results.   I certify that I have reviewed the entire raw data recording prior to the issuance of this report in accordance with the Standards of Accreditation of the American Academy of Sleep Medicine (AASM)   Star Age, MD, PhD Diplomat, American Board of Psychiatry and Neurology (Neurology and Sleep Medicine)

## 2016-08-27 DIAGNOSIS — M1712 Unilateral primary osteoarthritis, left knee: Secondary | ICD-10-CM | POA: Diagnosis not present

## 2016-08-28 ENCOUNTER — Telehealth: Payer: Self-pay

## 2016-08-28 NOTE — Telephone Encounter (Signed)
-----   Message from Star Age, MD sent at 08/24/2016  1:01 PM EST ----- Patient referred by Dr. Forde Dandy, seen by me on 08/13/16, diagnostic PSG on 08/23/16.   Please call and notify the patient that the recent sleep study did not show any significant obstructive sleep apnea, only mild OSA during dream/REM sleep. Did not sleep very well and not very much. Please inform patient that I we can go over the details of the study during a follow up appointment. Arrange a followup appointment. Also, route or fax report to PCP and referring MD, if other than PCP.  Once you have spoken to patient, you can close this encounter.   Thanks,  Star Age, MD, PhD [facility])

## 2016-08-28 NOTE — Telephone Encounter (Signed)
I spoke to patient and she is aware of results and recommendations. She did not want to make a f/u at this time but will call back if she changes her mind. I sent report to PCP.

## 2016-08-29 DIAGNOSIS — M1712 Unilateral primary osteoarthritis, left knee: Secondary | ICD-10-CM | POA: Diagnosis not present

## 2016-08-29 DIAGNOSIS — S83242A Other tear of medial meniscus, current injury, left knee, initial encounter: Secondary | ICD-10-CM | POA: Diagnosis not present

## 2016-08-29 DIAGNOSIS — M7052 Other bursitis of knee, left knee: Secondary | ICD-10-CM | POA: Diagnosis not present

## 2016-08-31 DIAGNOSIS — M1712 Unilateral primary osteoarthritis, left knee: Secondary | ICD-10-CM | POA: Diagnosis not present

## 2016-09-06 DIAGNOSIS — M1712 Unilateral primary osteoarthritis, left knee: Secondary | ICD-10-CM | POA: Diagnosis not present

## 2016-09-11 DIAGNOSIS — M1712 Unilateral primary osteoarthritis, left knee: Secondary | ICD-10-CM | POA: Diagnosis not present

## 2016-09-13 DIAGNOSIS — M1712 Unilateral primary osteoarthritis, left knee: Secondary | ICD-10-CM | POA: Diagnosis not present

## 2016-09-18 DIAGNOSIS — M1712 Unilateral primary osteoarthritis, left knee: Secondary | ICD-10-CM | POA: Diagnosis not present

## 2016-09-20 DIAGNOSIS — M1712 Unilateral primary osteoarthritis, left knee: Secondary | ICD-10-CM | POA: Diagnosis not present

## 2016-12-28 DIAGNOSIS — E784 Other hyperlipidemia: Secondary | ICD-10-CM | POA: Diagnosis not present

## 2016-12-28 DIAGNOSIS — E0520 Thyrotoxicosis with toxic multinodular goiter without thyrotoxic crisis or storm: Secondary | ICD-10-CM | POA: Diagnosis not present

## 2017-01-02 DIAGNOSIS — D126 Benign neoplasm of colon, unspecified: Secondary | ICD-10-CM | POA: Diagnosis not present

## 2017-01-02 DIAGNOSIS — M8580 Other specified disorders of bone density and structure, unspecified site: Secondary | ICD-10-CM | POA: Diagnosis not present

## 2017-01-02 DIAGNOSIS — E784 Other hyperlipidemia: Secondary | ICD-10-CM | POA: Diagnosis not present

## 2017-01-02 DIAGNOSIS — E0520 Thyrotoxicosis with toxic multinodular goiter without thyrotoxic crisis or storm: Secondary | ICD-10-CM | POA: Diagnosis not present

## 2017-01-02 DIAGNOSIS — E559 Vitamin D deficiency, unspecified: Secondary | ICD-10-CM | POA: Diagnosis not present

## 2017-01-02 DIAGNOSIS — Z1389 Encounter for screening for other disorder: Secondary | ICD-10-CM | POA: Diagnosis not present

## 2017-01-08 DIAGNOSIS — N611 Abscess of the breast and nipple: Secondary | ICD-10-CM | POA: Diagnosis not present

## 2017-01-08 DIAGNOSIS — N63 Unspecified lump in unspecified breast: Secondary | ICD-10-CM | POA: Diagnosis not present

## 2017-01-10 ENCOUNTER — Ambulatory Visit: Payer: Medicare Other | Admitting: Podiatry

## 2017-01-10 ENCOUNTER — Other Ambulatory Visit: Payer: Self-pay | Admitting: Obstetrics and Gynecology

## 2017-01-10 DIAGNOSIS — N611 Abscess of the breast and nipple: Secondary | ICD-10-CM

## 2017-01-16 ENCOUNTER — Other Ambulatory Visit: Payer: Self-pay | Admitting: Pulmonary Disease

## 2017-01-16 ENCOUNTER — Ambulatory Visit
Admission: RE | Admit: 2017-01-16 | Discharge: 2017-01-16 | Disposition: A | Discharge status: Home / Self Care | Payer: Medicare Other | Source: Ambulatory Visit | Attending: Obstetrics and Gynecology | Admitting: Obstetrics and Gynecology

## 2017-01-16 DIAGNOSIS — R928 Other abnormal and inconclusive findings on diagnostic imaging of breast: Secondary | ICD-10-CM | POA: Diagnosis not present

## 2017-01-16 DIAGNOSIS — N611 Abscess of the breast and nipple: Secondary | ICD-10-CM

## 2017-01-16 DIAGNOSIS — N6489 Other specified disorders of breast: Secondary | ICD-10-CM | POA: Diagnosis not present

## 2017-01-18 ENCOUNTER — Encounter: Payer: Self-pay | Admitting: Podiatry

## 2017-01-18 ENCOUNTER — Ambulatory Visit (INDEPENDENT_AMBULATORY_CARE_PROVIDER_SITE_OTHER): Payer: Medicare Other | Admitting: Podiatry

## 2017-01-18 DIAGNOSIS — B351 Tinea unguium: Secondary | ICD-10-CM | POA: Diagnosis not present

## 2017-01-18 DIAGNOSIS — M79609 Pain in unspecified limb: Secondary | ICD-10-CM

## 2017-01-18 NOTE — Progress Notes (Signed)
Subjective:    Patient ID: Kristen Pena, female   DOB: 71 y.o.   MRN: [id number]   HPI patient presents with elongated nails that are thick and painful 1-5 both feet that makes shoe gear difficult    ROS      Objective:  Physical Exam neurovascular status intact with thick yellow brittle nailbeds 1-5 both feet that are painful     Assessment:    Mycotic nail infection with pain 1-5 both feet     Plan:    Debride painful nailbeds 1-5 both feet with no iatrogenic bleeding noted

## 2017-01-25 ENCOUNTER — Other Ambulatory Visit: Payer: Self-pay | Admitting: Obstetrics and Gynecology

## 2017-01-25 DIAGNOSIS — N611 Abscess of the breast and nipple: Secondary | ICD-10-CM

## 2017-02-05 ENCOUNTER — Other Ambulatory Visit: Payer: Self-pay | Admitting: Obstetrics and Gynecology

## 2017-02-05 ENCOUNTER — Ambulatory Visit
Admission: RE | Admit: 2017-02-05 | Discharge: 2017-02-05 | Disposition: A | Discharge status: Home / Self Care | Payer: Medicare Other | Source: Ambulatory Visit | Attending: Obstetrics and Gynecology | Admitting: Obstetrics and Gynecology

## 2017-02-05 ENCOUNTER — Ambulatory Visit
Admission: RE | Admit: 2017-02-05 | Discharge: 2017-02-05 | Disposition: A | Discharge status: Home / Self Care | Payer: Medicare Other | Source: Ambulatory Visit | Attending: Pulmonary Disease | Admitting: Pulmonary Disease

## 2017-02-05 DIAGNOSIS — N611 Abscess of the breast and nipple: Secondary | ICD-10-CM

## 2017-02-05 DIAGNOSIS — N6489 Other specified disorders of breast: Secondary | ICD-10-CM | POA: Diagnosis not present

## 2017-02-05 DIAGNOSIS — R928 Other abnormal and inconclusive findings on diagnostic imaging of breast: Secondary | ICD-10-CM | POA: Diagnosis not present

## 2017-03-20 DIAGNOSIS — M1712 Unilateral primary osteoarthritis, left knee: Secondary | ICD-10-CM | POA: Diagnosis not present

## 2017-04-01 ENCOUNTER — Ambulatory Visit
Admission: RE | Admit: 2017-04-01 | Discharge: 2017-04-01 | Disposition: A | Discharge status: Home / Self Care | Payer: Medicare Other | Source: Ambulatory Visit | Attending: Obstetrics and Gynecology | Admitting: Obstetrics and Gynecology

## 2017-04-01 ENCOUNTER — Ambulatory Visit: Payer: Medicare Other

## 2017-04-01 DIAGNOSIS — N611 Abscess of the breast and nipple: Secondary | ICD-10-CM

## 2017-04-01 DIAGNOSIS — R922 Inconclusive mammogram: Secondary | ICD-10-CM | POA: Diagnosis not present

## 2017-04-13 DIAGNOSIS — Z23 Encounter for immunization: Secondary | ICD-10-CM | POA: Diagnosis not present

## 2017-04-15 ENCOUNTER — Telehealth: Payer: Self-pay | Admitting: Gastroenterology

## 2017-04-15 NOTE — Telephone Encounter (Signed)
Patient reports that she has a hard stool she is not able to pass.  She is advised to increase the amount of water she is drinking.  Try Miralax TID and can try a glycerine suppository.  She is advised that she should avoid straining.  She had the last BM on Saturday and had no problems.  She verbalized understanding.

## 2017-04-16 DIAGNOSIS — M1712 Unilateral primary osteoarthritis, left knee: Secondary | ICD-10-CM | POA: Diagnosis not present

## 2017-04-18 ENCOUNTER — Ambulatory Visit (INDEPENDENT_AMBULATORY_CARE_PROVIDER_SITE_OTHER): Payer: Medicare Other | Admitting: Physician Assistant

## 2017-04-18 ENCOUNTER — Encounter: Payer: Self-pay | Admitting: Physician Assistant

## 2017-04-18 VITALS — BP 130/70 | HR 81 | Ht 66.0 in | Wt 155.0 lb

## 2017-04-18 DIAGNOSIS — K5900 Constipation, unspecified: Secondary | ICD-10-CM

## 2017-04-18 DIAGNOSIS — Z86010 Personal history of colonic polyps: Secondary | ICD-10-CM | POA: Diagnosis not present

## 2017-04-18 NOTE — Progress Notes (Signed)
Subjective:    Patient ID: Kristen Pena, female    DOB: [birth year], 71 y.o.   MRN: [id number]  HPI [name] is a pleasant 71 year old African-American female, known to Dr. Fuller Plan who comes in today with complaints of acute constipation. She was last seen in 2017 when she underwent colonoscopy in April 2017 for follow-up of polyps and family history of colon cancer. She was found to have a 5 mm polyp in the descending colon, path consistent with tubular adenoma and there were no diverticuli and no hemorrhoids. Patient has history of hyperlipidemia, GERD and obstructive sleep apnea. He says she's usually very regular and has not had any difficulty with constipation. She had to take course of antibiotics about a month ago and became acutely constipated after that. She started eating pears and prunes and taking prune juice but nothing was helping. Finally she called here a few days ago and was advised to try glycerin suppositories and MiraLAX 3 times daily. She says she has been passing hard ball like stools. She had an episode of couple of days ago with passing a very large hard bowel movement which was extremely difficult but once she was able to clear this with some manual assistance she says she passed a very large amount of stool and has felt better. She continues on MiraLAX but has only been taking it once daily. Over the past few days she is having bowel movements but passing just very small amounts. No complaints of abdominal pain or cramping, appetite has been fine, weight stable. No melena or hematochezia.   Review of Systems Pertinent positive and negative review of systems were noted in the above HPI section.  All other review of systems was otherwise negative.  Outpatient Encounter Prescriptions as of 04/18/2017  Medication Sig  . Cholecalciferol (VITAMIN D PO) Take by mouth.  . Fexofenadine HCl (ALLEGRA PO) Take by mouth.  . hydrochlorothiazide (MICROZIDE) 12.5 MG capsule   .  montelukast (SINGULAIR) 10 MG tablet   . rosuvastatin (CRESTOR) 10 MG tablet Take 10 mg by mouth 2 (two) times a week.   . Vitamin D, Ergocalciferol, (DRISDOL) 50000 units CAPS capsule Take 1 capsule by mouth 3 (three) times a week.   No facility-administered encounter medications on file as of 04/18/2017.    No Known Allergies Patient Active Problem List   Diagnosis Date Noted  . PALPITATIONS 09/30/2009   Social History   Social History  . Marital status: Single    Spouse name: N/A  . Number of children: 2  . Years of education: N/A   Occupational History  . Restaurant    Social History Main Topics  . Smoking status: Never Smoker  . Smokeless tobacco: Never Used  . Alcohol use 0.0 oz/week  . Drug use: No  . Sexual activity: Not on file   Other Topics Concern  . Not on file   Social History Narrative  . No narrative on file    Ms. Gettinger's family history includes Colon cancer (age of onset: 29) in her mother; Stomach cancer in her father.      Objective:    Vitals:   04/18/17 1125  BP: 130/70  Pulse: 81    Physical Exam  well-developed older African-American female in no acute distress, very pleasant blood pressure 130/70 pulse 81, BMI 25.0. HEENT; nontraumatic normocephalic EOMI PERRLA sclera anicteric, Cardiovascular; regular rate and rhythm with S1-S2 no murmur or gallop, Pulmonary; clear bilaterally, Abdomen ;soft, nontender nondistended bowel sounds are  active no palpable mass or hepatosplenomegaly, Rectal ;exam not done, Ext; no clubbing cyanosis or edema skin warm and dry, Neuropsych; mood and affect appropriate       Assessment & Plan:   #27 71 year old African-American female with acute episode of severe obstipation, possibly medication induced/antibiotics.  #2 history of adenomatous colon polyps-last colonoscopy April 2017 due for follow-up April 2022 #3 family history of colon cancer #4 obstructive sleep apnea #5 hyperlipidemia  Plan; patient  advised to drink 6-8 glasses of water per day, We will give her a bowel purge with a Plenvu  bowel prep sample, after bowel purge she will continue to drink one glass of prune juice daily, and take MiraLAX 17 g in 8 ounces of water daily High-fiber diet She is advised to call back for advice if she is continuing to have difficulty on above regimen.   Zareena Willis Genia Harold PA-C 04/18/2017   Cc: Reynold Bowen, MD

## 2017-04-18 NOTE — Patient Instructions (Addendum)
Do the Plenvu bowel purge. Follow the instructions.  There are 2 doses. Drink the Mango first. Then the fruit punch.   After purging your bowels. You can take Miralax 17 grams daily in8 oz of water. Drink Prune juice daily.   Call us back for advice if still having problems.   Follow up with Dr. Fuller Plan for colonoscopy in 2022.

## 2017-04-18 NOTE — Progress Notes (Signed)
Reviewed and agree with management plan.  Bernetta Sutley T. Brennan Litzinger, MD FACG 

## 2017-04-22 ENCOUNTER — Ambulatory Visit (INDEPENDENT_AMBULATORY_CARE_PROVIDER_SITE_OTHER): Payer: Medicare Other | Admitting: Podiatry

## 2017-04-22 ENCOUNTER — Encounter: Payer: Self-pay | Admitting: Podiatry

## 2017-04-22 DIAGNOSIS — G629 Polyneuropathy, unspecified: Secondary | ICD-10-CM | POA: Diagnosis not present

## 2017-04-22 DIAGNOSIS — M1712 Unilateral primary osteoarthritis, left knee: Secondary | ICD-10-CM | POA: Diagnosis not present

## 2017-04-22 DIAGNOSIS — B351 Tinea unguium: Secondary | ICD-10-CM | POA: Diagnosis not present

## 2017-04-22 DIAGNOSIS — M79609 Pain in unspecified limb: Secondary | ICD-10-CM

## 2017-04-22 NOTE — Patient Instructions (Signed)
Peripheral Neuropathy Peripheral neuropathy is a type of nerve damage. It affects nerves that carry signals between the spinal cord and other parts of the body. These are called peripheral nerves. With peripheral neuropathy, one nerve or a group of nerves may be damaged. What are the causes? Many things can damage peripheral nerves. For some people with peripheral neuropathy, the cause is unknown. Some causes include:  Diabetes. This is the most common cause of peripheral neuropathy.  Injury to a nerve.  Pressure or stress on a nerve that lasts a long time.  Too little vitamin B. Alcoholism can lead to this.  Infections.  Autoimmune diseases, such as multiple sclerosis and systemic lupus erythematosus.  Inherited nerve diseases.  Some medicines, such as cancer drugs.  Toxic substances, such as lead and mercury.  Too little blood flowing to the legs.  Kidney disease.  Thyroid disease.  What are the signs or symptoms? Different people have different symptoms. The symptoms you have will depend on which of your nerves is damaged. Common symptoms include:  Loss of feeling (numbness) in the feet and hands.  Tingling in the feet and hands.  Pain that burns.  Very sensitive skin.  Weakness.  Not being able to move a part of the body (paralysis).  Muscle twitching.  Clumsiness or poor coordination.  Loss of balance.  Not being able to control your bladder.  Feeling dizzy.  Sexual problems.  How is this diagnosed? Peripheral neuropathy is a symptom, not a disease. Finding the cause of peripheral neuropathy can be hard. To figure that out, your health care provider will take a medical history and do a physical exam. A neurological exam will also be done. This involves checking things affected by your brain, spinal cord, and nerves (nervous system). For example, your health care provider will check your reflexes, how you move, and what you can feel. Other types of tests  may also be ordered, such as:  Blood tests.  A test of the fluid in your spinal cord.  Imaging tests, such as CT scans or an MRI.  Electromyography (EMG). This test checks the nerves that control muscles.  Nerve conduction velocity tests. These tests check how fast messages pass through your nerves.  Nerve biopsy. A small piece of nerve is removed. It is then checked under a microscope.  How is this treated?  Medicine is often used to treat peripheral neuropathy. Medicines may include: ? Pain-relieving medicines. Prescription or over-the-counter medicine may be suggested. ? Antiseizure medicine. This may be used for pain. ? Antidepressants. These also may help ease pain from neuropathy. ? Lidocaine. This is a numbing medicine. You might wear a patch or be given a shot. ? Mexiletine. This medicine is typically used to help control irregular heart rhythms.  Surgery. Surgery may be needed to relieve pressure on a nerve or to destroy a nerve that is causing pain.  Physical therapy to help movement.  Assistive devices to help movement. Follow these instructions at home:  Only take over-the-counter or prescription medicines as directed by your health care provider. Follow the instructions carefully for any given medicines. Do not take any other medicines without first getting approval from your health care provider.  If you have diabetes, work closely with your health care provider to keep your blood sugar under control.  If you have numbness in your feet: ? Check every day for signs of injury or infection. Watch for redness, warmth, and swelling. ? Wear padded socks and comfortable   shoes. These help protect your feet.  Do not do things that put pressure on your damaged nerve.  Do not smoke. Smoking keeps blood from getting to damaged nerves.  Avoid or limit alcohol. Too much alcohol can cause a lack of B vitamins. These vitamins are needed for healthy nerves.  Develop a good  support system. Coping with peripheral neuropathy can be stressful. Talk to a mental health specialist or join a support group if you are struggling.  Follow up with your health care provider as directed. Contact a health care provider if:  You have new signs or symptoms of peripheral neuropathy.  You are struggling emotionally from dealing with peripheral neuropathy.  You have a fever. Get help right away if:  You have an injury or infection that is not healing.  You feel very dizzy or begin vomiting.  You have chest pain.  You have trouble breathing. This information is not intended to replace advice given to you by your health care provider. Make sure you discuss any questions you have with your health care provider. Document Released: 05/25/2002 Document Revised: 11/10/2015 Document Reviewed: 02/09/2013 Elsevier Interactive Patient Education  2017 Elsevier Inc.  

## 2017-04-23 NOTE — Progress Notes (Signed)
Subjective:    Patient ID: Kristen Pena, female   DOB: 71 y.o.   MRN: [id number]   HPI patient presents with painful nailbeds 1-5 both feet and also concerned about numbness in the digits 1 through 3 bilateral    ROS      Objective:  Physical Exam neurovascular status intact with thick yellow brittle nailbeds 1-5 both feet that are painful and mild numbness in the lesser digits but no indications of diminished vibratory sensation bilateral     Assessment:    Mycotic nail infection with pain with possibility for low-grade idiopathic neuropathy condition     Plan:    H&P condition reviewed with patient. At this time nail debridement 1-5 both feet accomplished and I discussed for the neuropathy that she just take a wait-and-see attitude is a don't see any organic issues at this time

## 2017-05-06 DIAGNOSIS — M1712 Unilateral primary osteoarthritis, left knee: Secondary | ICD-10-CM | POA: Diagnosis not present

## 2017-06-25 DIAGNOSIS — E7849 Other hyperlipidemia: Secondary | ICD-10-CM | POA: Diagnosis not present

## 2017-06-25 DIAGNOSIS — E0520 Thyrotoxicosis with toxic multinodular goiter without thyrotoxic crisis or storm: Secondary | ICD-10-CM | POA: Diagnosis not present

## 2017-06-25 DIAGNOSIS — I10 Essential (primary) hypertension: Secondary | ICD-10-CM | POA: Diagnosis not present

## 2017-06-25 DIAGNOSIS — R82998 Other abnormal findings in urine: Secondary | ICD-10-CM | POA: Diagnosis not present

## 2017-06-25 DIAGNOSIS — M859 Disorder of bone density and structure, unspecified: Secondary | ICD-10-CM | POA: Diagnosis not present

## 2017-06-28 DIAGNOSIS — H04123 Dry eye syndrome of bilateral lacrimal glands: Secondary | ICD-10-CM | POA: Diagnosis not present

## 2017-06-28 DIAGNOSIS — H2513 Age-related nuclear cataract, bilateral: Secondary | ICD-10-CM | POA: Diagnosis not present

## 2017-06-28 DIAGNOSIS — H43812 Vitreous degeneration, left eye: Secondary | ICD-10-CM | POA: Diagnosis not present

## 2017-06-28 DIAGNOSIS — H43392 Other vitreous opacities, left eye: Secondary | ICD-10-CM | POA: Diagnosis not present

## 2017-07-02 DIAGNOSIS — E7849 Other hyperlipidemia: Secondary | ICD-10-CM | POA: Diagnosis not present

## 2017-07-02 DIAGNOSIS — Z23 Encounter for immunization: Secondary | ICD-10-CM | POA: Diagnosis not present

## 2017-07-02 DIAGNOSIS — M859 Disorder of bone density and structure, unspecified: Secondary | ICD-10-CM | POA: Diagnosis not present

## 2017-07-02 DIAGNOSIS — M8580 Other specified disorders of bone density and structure, unspecified site: Secondary | ICD-10-CM | POA: Diagnosis not present

## 2017-07-02 DIAGNOSIS — R740 Nonspecific elevation of levels of transaminase and lactic acid dehydrogenase [LDH]: Secondary | ICD-10-CM | POA: Diagnosis not present

## 2017-07-02 DIAGNOSIS — D126 Benign neoplasm of colon, unspecified: Secondary | ICD-10-CM | POA: Diagnosis not present

## 2017-07-02 DIAGNOSIS — E559 Vitamin D deficiency, unspecified: Secondary | ICD-10-CM | POA: Diagnosis not present

## 2017-07-02 DIAGNOSIS — E0520 Thyrotoxicosis with toxic multinodular goiter without thyrotoxic crisis or storm: Secondary | ICD-10-CM | POA: Diagnosis not present

## 2017-07-02 DIAGNOSIS — Z6825 Body mass index (BMI) 25.0-25.9, adult: Secondary | ICD-10-CM | POA: Diagnosis not present

## 2017-07-02 DIAGNOSIS — Z1389 Encounter for screening for other disorder: Secondary | ICD-10-CM | POA: Diagnosis not present

## 2017-07-02 DIAGNOSIS — Z0000 Encounter for general adult medical examination without abnormal findings: Secondary | ICD-10-CM | POA: Diagnosis not present

## 2017-07-02 DIAGNOSIS — R208 Other disturbances of skin sensation: Secondary | ICD-10-CM | POA: Diagnosis not present

## 2017-07-12 DIAGNOSIS — Z1212 Encounter for screening for malignant neoplasm of rectum: Secondary | ICD-10-CM | POA: Diagnosis not present

## 2017-07-15 DIAGNOSIS — J309 Allergic rhinitis, unspecified: Secondary | ICD-10-CM | POA: Diagnosis not present

## 2017-07-15 DIAGNOSIS — J069 Acute upper respiratory infection, unspecified: Secondary | ICD-10-CM | POA: Diagnosis not present

## 2017-07-15 DIAGNOSIS — S60459A Superficial foreign body of unspecified finger, initial encounter: Secondary | ICD-10-CM | POA: Diagnosis not present

## 2017-07-15 DIAGNOSIS — Z6825 Body mass index (BMI) 25.0-25.9, adult: Secondary | ICD-10-CM | POA: Diagnosis not present

## 2017-07-15 DIAGNOSIS — R05 Cough: Secondary | ICD-10-CM | POA: Diagnosis not present

## 2017-07-24 DIAGNOSIS — M1712 Unilateral primary osteoarthritis, left knee: Secondary | ICD-10-CM | POA: Diagnosis not present

## 2017-07-24 DIAGNOSIS — M25561 Pain in right knee: Secondary | ICD-10-CM | POA: Diagnosis not present

## 2017-07-24 DIAGNOSIS — M1711 Unilateral primary osteoarthritis, right knee: Secondary | ICD-10-CM | POA: Diagnosis not present

## 2017-07-24 DIAGNOSIS — M170 Bilateral primary osteoarthritis of knee: Secondary | ICD-10-CM | POA: Diagnosis not present

## 2017-07-29 ENCOUNTER — Other Ambulatory Visit: Payer: Medicare Other

## 2017-08-05 DIAGNOSIS — M25532 Pain in left wrist: Secondary | ICD-10-CM | POA: Diagnosis not present

## 2017-08-30 DIAGNOSIS — M170 Bilateral primary osteoarthritis of knee: Secondary | ICD-10-CM | POA: Diagnosis not present

## 2017-09-06 DIAGNOSIS — M1712 Unilateral primary osteoarthritis, left knee: Secondary | ICD-10-CM | POA: Diagnosis not present

## 2017-09-06 DIAGNOSIS — M170 Bilateral primary osteoarthritis of knee: Secondary | ICD-10-CM | POA: Diagnosis not present

## 2017-09-06 DIAGNOSIS — M1711 Unilateral primary osteoarthritis, right knee: Secondary | ICD-10-CM | POA: Diagnosis not present

## 2017-09-13 DIAGNOSIS — M1712 Unilateral primary osteoarthritis, left knee: Secondary | ICD-10-CM | POA: Diagnosis not present

## 2017-09-13 DIAGNOSIS — M170 Bilateral primary osteoarthritis of knee: Secondary | ICD-10-CM | POA: Diagnosis not present

## 2017-09-13 DIAGNOSIS — M1711 Unilateral primary osteoarthritis, right knee: Secondary | ICD-10-CM | POA: Diagnosis not present

## 2017-12-25 DIAGNOSIS — E0520 Thyrotoxicosis with toxic multinodular goiter without thyrotoxic crisis or storm: Secondary | ICD-10-CM | POA: Diagnosis not present

## 2017-12-31 DIAGNOSIS — D126 Benign neoplasm of colon, unspecified: Secondary | ICD-10-CM | POA: Diagnosis not present

## 2017-12-31 DIAGNOSIS — E559 Vitamin D deficiency, unspecified: Secondary | ICD-10-CM | POA: Diagnosis not present

## 2017-12-31 DIAGNOSIS — M8580 Other specified disorders of bone density and structure, unspecified site: Secondary | ICD-10-CM | POA: Diagnosis not present

## 2017-12-31 DIAGNOSIS — E7849 Other hyperlipidemia: Secondary | ICD-10-CM | POA: Diagnosis not present

## 2017-12-31 DIAGNOSIS — Z6825 Body mass index (BMI) 25.0-25.9, adult: Secondary | ICD-10-CM | POA: Diagnosis not present

## 2017-12-31 DIAGNOSIS — E0520 Thyrotoxicosis with toxic multinodular goiter without thyrotoxic crisis or storm: Secondary | ICD-10-CM | POA: Diagnosis not present

## 2017-12-31 DIAGNOSIS — R740 Nonspecific elevation of levels of transaminase and lactic acid dehydrogenase [LDH]: Secondary | ICD-10-CM | POA: Diagnosis not present

## 2017-12-31 DIAGNOSIS — Z1389 Encounter for screening for other disorder: Secondary | ICD-10-CM | POA: Diagnosis not present

## 2018-01-13 DIAGNOSIS — Z6825 Body mass index (BMI) 25.0-25.9, adult: Secondary | ICD-10-CM | POA: Diagnosis not present

## 2018-01-13 DIAGNOSIS — L700 Acne vulgaris: Secondary | ICD-10-CM | POA: Diagnosis not present

## 2018-01-31 DIAGNOSIS — J069 Acute upper respiratory infection, unspecified: Secondary | ICD-10-CM | POA: Diagnosis not present

## 2018-03-04 ENCOUNTER — Other Ambulatory Visit: Payer: Self-pay | Admitting: Obstetrics and Gynecology

## 2018-03-04 DIAGNOSIS — Z1231 Encounter for screening mammogram for malignant neoplasm of breast: Secondary | ICD-10-CM

## 2018-03-26 DIAGNOSIS — Z23 Encounter for immunization: Secondary | ICD-10-CM | POA: Diagnosis not present

## 2018-04-03 ENCOUNTER — Ambulatory Visit
Admission: RE | Admit: 2018-04-03 | Discharge: 2018-04-03 | Disposition: A | Discharge status: Home / Self Care | Payer: Medicare Other | Source: Ambulatory Visit | Attending: Obstetrics and Gynecology | Admitting: Obstetrics and Gynecology

## 2018-04-03 DIAGNOSIS — Z1231 Encounter for screening mammogram for malignant neoplasm of breast: Secondary | ICD-10-CM

## 2018-04-07 DIAGNOSIS — L814 Other melanin hyperpigmentation: Secondary | ICD-10-CM | POA: Diagnosis not present

## 2018-04-07 DIAGNOSIS — L821 Other seborrheic keratosis: Secondary | ICD-10-CM | POA: Diagnosis not present

## 2018-04-07 DIAGNOSIS — D225 Melanocytic nevi of trunk: Secondary | ICD-10-CM | POA: Diagnosis not present

## 2018-04-22 DIAGNOSIS — M545 Low back pain: Secondary | ICD-10-CM | POA: Diagnosis not present

## 2018-05-13 DIAGNOSIS — Z124 Encounter for screening for malignant neoplasm of cervix: Secondary | ICD-10-CM | POA: Diagnosis not present

## 2018-05-13 DIAGNOSIS — Z01419 Encounter for gynecological examination (general) (routine) without abnormal findings: Secondary | ICD-10-CM | POA: Diagnosis not present

## 2018-06-13 DIAGNOSIS — Z6825 Body mass index (BMI) 25.0-25.9, adult: Secondary | ICD-10-CM | POA: Diagnosis not present

## 2018-06-13 DIAGNOSIS — R109 Unspecified abdominal pain: Secondary | ICD-10-CM | POA: Diagnosis not present

## 2018-06-30 DIAGNOSIS — H25013 Cortical age-related cataract, bilateral: Secondary | ICD-10-CM | POA: Diagnosis not present

## 2018-06-30 DIAGNOSIS — M859 Disorder of bone density and structure, unspecified: Secondary | ICD-10-CM | POA: Diagnosis not present

## 2018-06-30 DIAGNOSIS — H31001 Unspecified chorioretinal scars, right eye: Secondary | ICD-10-CM | POA: Diagnosis not present

## 2018-06-30 DIAGNOSIS — H04123 Dry eye syndrome of bilateral lacrimal glands: Secondary | ICD-10-CM | POA: Diagnosis not present

## 2018-06-30 DIAGNOSIS — E0520 Thyrotoxicosis with toxic multinodular goiter without thyrotoxic crisis or storm: Secondary | ICD-10-CM | POA: Diagnosis not present

## 2018-06-30 DIAGNOSIS — H2513 Age-related nuclear cataract, bilateral: Secondary | ICD-10-CM | POA: Diagnosis not present

## 2018-06-30 DIAGNOSIS — N390 Urinary tract infection, site not specified: Secondary | ICD-10-CM | POA: Diagnosis not present

## 2018-06-30 DIAGNOSIS — E7849 Other hyperlipidemia: Secondary | ICD-10-CM | POA: Diagnosis not present

## 2018-07-04 DIAGNOSIS — Z0000 Encounter for general adult medical examination without abnormal findings: Secondary | ICD-10-CM | POA: Diagnosis not present

## 2018-08-28 DIAGNOSIS — M545 Low back pain: Secondary | ICD-10-CM | POA: Diagnosis not present

## 2018-08-28 DIAGNOSIS — M5136 Other intervertebral disc degeneration, lumbar region: Secondary | ICD-10-CM | POA: Diagnosis not present

## 2018-10-28 DIAGNOSIS — I10 Essential (primary) hypertension: Secondary | ICD-10-CM | POA: Diagnosis not present

## 2018-10-28 DIAGNOSIS — J309 Allergic rhinitis, unspecified: Secondary | ICD-10-CM | POA: Diagnosis not present

## 2019-01-02 DIAGNOSIS — M8580 Other specified disorders of bone density and structure, unspecified site: Secondary | ICD-10-CM | POA: Diagnosis not present

## 2019-01-02 DIAGNOSIS — F419 Anxiety disorder, unspecified: Secondary | ICD-10-CM | POA: Diagnosis not present

## 2019-01-02 DIAGNOSIS — R740 Nonspecific elevation of levels of transaminase and lactic acid dehydrogenase [LDH]: Secondary | ICD-10-CM | POA: Diagnosis not present

## 2019-01-02 DIAGNOSIS — E0520 Thyrotoxicosis with toxic multinodular goiter without thyrotoxic crisis or storm: Secondary | ICD-10-CM | POA: Diagnosis not present

## 2019-01-02 DIAGNOSIS — E785 Hyperlipidemia, unspecified: Secondary | ICD-10-CM | POA: Diagnosis not present

## 2019-01-02 DIAGNOSIS — R209 Unspecified disturbances of skin sensation: Secondary | ICD-10-CM | POA: Diagnosis not present

## 2019-01-02 DIAGNOSIS — E7849 Other hyperlipidemia: Secondary | ICD-10-CM | POA: Diagnosis not present

## 2019-01-02 DIAGNOSIS — E559 Vitamin D deficiency, unspecified: Secondary | ICD-10-CM | POA: Diagnosis not present

## 2019-01-02 DIAGNOSIS — D126 Benign neoplasm of colon, unspecified: Secondary | ICD-10-CM | POA: Diagnosis not present

## 2019-02-20 DIAGNOSIS — Z23 Encounter for immunization: Secondary | ICD-10-CM | POA: Diagnosis not present

## 2019-03-06 ENCOUNTER — Other Ambulatory Visit: Payer: Self-pay | Admitting: Obstetrics and Gynecology

## 2019-03-06 DIAGNOSIS — Z1231 Encounter for screening mammogram for malignant neoplasm of breast: Secondary | ICD-10-CM

## 2019-04-06 DIAGNOSIS — J3089 Other allergic rhinitis: Secondary | ICD-10-CM | POA: Diagnosis not present

## 2019-04-06 DIAGNOSIS — H1045 Other chronic allergic conjunctivitis: Secondary | ICD-10-CM | POA: Diagnosis not present

## 2019-04-06 DIAGNOSIS — J3081 Allergic rhinitis due to animal (cat) (dog) hair and dander: Secondary | ICD-10-CM | POA: Diagnosis not present

## 2019-04-06 DIAGNOSIS — J301 Allergic rhinitis due to pollen: Secondary | ICD-10-CM | POA: Diagnosis not present

## 2019-04-07 DIAGNOSIS — L814 Other melanin hyperpigmentation: Secondary | ICD-10-CM | POA: Diagnosis not present

## 2019-04-07 DIAGNOSIS — L821 Other seborrheic keratosis: Secondary | ICD-10-CM | POA: Diagnosis not present

## 2019-04-07 DIAGNOSIS — D225 Melanocytic nevi of trunk: Secondary | ICD-10-CM | POA: Diagnosis not present

## 2019-04-23 ENCOUNTER — Other Ambulatory Visit: Payer: Self-pay

## 2019-04-23 ENCOUNTER — Ambulatory Visit
Admission: RE | Admit: 2019-04-23 | Discharge: 2019-04-23 | Disposition: A | Discharge status: Home / Self Care | Payer: Medicare Other | Source: Ambulatory Visit | Attending: Obstetrics and Gynecology | Admitting: Obstetrics and Gynecology

## 2019-04-23 DIAGNOSIS — Z1231 Encounter for screening mammogram for malignant neoplasm of breast: Secondary | ICD-10-CM

## 2019-04-28 ENCOUNTER — Other Ambulatory Visit: Payer: Self-pay | Admitting: Obstetrics and Gynecology

## 2019-04-28 DIAGNOSIS — R928 Other abnormal and inconclusive findings on diagnostic imaging of breast: Secondary | ICD-10-CM

## 2019-05-04 ENCOUNTER — Other Ambulatory Visit: Payer: Self-pay

## 2019-05-04 ENCOUNTER — Ambulatory Visit: Payer: Medicare Other

## 2019-05-04 ENCOUNTER — Ambulatory Visit
Admission: RE | Admit: 2019-05-04 | Discharge: 2019-05-04 | Disposition: A | Discharge status: Home / Self Care | Payer: Medicare Other | Source: Ambulatory Visit | Attending: Obstetrics and Gynecology | Admitting: Obstetrics and Gynecology

## 2019-05-04 DIAGNOSIS — R928 Other abnormal and inconclusive findings on diagnostic imaging of breast: Secondary | ICD-10-CM

## 2019-05-27 ENCOUNTER — Ambulatory Visit (INDEPENDENT_AMBULATORY_CARE_PROVIDER_SITE_OTHER): Payer: Medicare Other | Admitting: Gastroenterology

## 2019-05-27 ENCOUNTER — Other Ambulatory Visit: Payer: Self-pay

## 2019-05-27 ENCOUNTER — Encounter: Payer: Self-pay | Admitting: Gastroenterology

## 2019-05-27 VITALS — BP 144/82 | HR 85 | Temp 97.4°F | Ht 66.0 in | Wt 160.0 lb

## 2019-05-27 DIAGNOSIS — Z86010 Personal history of colonic polyps: Secondary | ICD-10-CM | POA: Diagnosis not present

## 2019-05-27 DIAGNOSIS — R140 Abdominal distension (gaseous): Secondary | ICD-10-CM | POA: Diagnosis not present

## 2019-05-27 DIAGNOSIS — K219 Gastro-esophageal reflux disease without esophagitis: Secondary | ICD-10-CM

## 2019-05-27 DIAGNOSIS — Z860101 Personal history of adenomatous and serrated colon polyps: Secondary | ICD-10-CM

## 2019-05-27 NOTE — Progress Notes (Signed)
    History of Present Illness: This is a 73 year old female with GERD, abdominal bloating and constipation.  She has made substantial adjustments to her diet with high fiber and increased water intake as well as avoiding any foods that trigger bloating or reflux.  She also notes that peppermint tea will help with bloating.  She is not taking prescription medications for management of GI symptoms.  Current Medications, Allergies, Past Medical History, Past Surgical History, Family History and Social History were reviewed in [employer] record.   Physical Exam: General: Well developed, well nourished, no acute distress Head: Normocephalic and atraumatic Eyes:  sclerae anicteric, EOMI Ears: Normal auditory acuity Mouth: No deformity or lesions Lungs: Clear throughout to auscultation Heart: Regular rate and rhythm; no murmurs, rubs or bruits Abdomen: Soft, non tender and non distended. No masses, hepatosplenomegaly or hernias noted. Normal Bowel sounds Rectal: Not done Musculoskeletal: Symmetrical with no gross deformities  Pulses:  Normal pulses noted Extremities: No clubbing, cyanosis, edema or deformities noted Neurological: Alert oriented x 4, grossly nonfocal Psychological:  Alert and cooperative. Normal mood and affect   Assessment and Recommendations:  1.  GERD with esophageal stricture. She wants to avoid prescription medications and manage with standard antireflux measures. Tums as needed if symptoms are frequently active consider famotidine 40 mg daily or pantoprazole 40 mg daily.  2.  Gas and bloating.  Avoid/minimize foods and beverages that exacerbate symptoms.  Continue peppermint tea as needed.  Gas-X or Beano 4 times daily as needed.  3.  Mild constipation.  Currently well controlled with a higher fiber intake and increase daily water intake.  4.  Personal history of adenomatous colon polyps.  Surveillance colonoscopy recommended in April 2022.

## 2019-05-27 NOTE — Patient Instructions (Signed)
Follow up as needed

## 2019-06-03 ENCOUNTER — Ambulatory Visit: Payer: Medicare Other | Attending: Internal Medicine

## 2019-06-03 ENCOUNTER — Other Ambulatory Visit: Payer: Self-pay

## 2019-06-03 DIAGNOSIS — Z20822 Contact with and (suspected) exposure to covid-19: Secondary | ICD-10-CM

## 2019-06-03 DIAGNOSIS — Z20828 Contact with and (suspected) exposure to other viral communicable diseases: Secondary | ICD-10-CM | POA: Diagnosis not present

## 2019-06-05 LAB — NOVEL CORONAVIRUS, NAA: SARS-CoV-2, NAA: NOT DETECTED

## 2019-06-22 DIAGNOSIS — E7849 Other hyperlipidemia: Secondary | ICD-10-CM | POA: Diagnosis not present

## 2019-06-22 DIAGNOSIS — E0520 Thyrotoxicosis with toxic multinodular goiter without thyrotoxic crisis or storm: Secondary | ICD-10-CM | POA: Diagnosis not present

## 2019-06-25 DIAGNOSIS — E0520 Thyrotoxicosis with toxic multinodular goiter without thyrotoxic crisis or storm: Secondary | ICD-10-CM | POA: Diagnosis not present

## 2019-06-26 DIAGNOSIS — F419 Anxiety disorder, unspecified: Secondary | ICD-10-CM | POA: Diagnosis not present

## 2019-06-26 DIAGNOSIS — K222 Esophageal obstruction: Secondary | ICD-10-CM | POA: Diagnosis not present

## 2019-06-26 DIAGNOSIS — R030 Elevated blood-pressure reading, without diagnosis of hypertension: Secondary | ICD-10-CM | POA: Diagnosis not present

## 2019-06-26 DIAGNOSIS — E0520 Thyrotoxicosis with toxic multinodular goiter without thyrotoxic crisis or storm: Secondary | ICD-10-CM | POA: Diagnosis not present

## 2019-06-26 DIAGNOSIS — R7401 Elevation of levels of liver transaminase levels: Secondary | ICD-10-CM | POA: Diagnosis not present

## 2019-06-26 DIAGNOSIS — I5189 Other ill-defined heart diseases: Secondary | ICD-10-CM | POA: Diagnosis not present

## 2019-06-26 DIAGNOSIS — D126 Benign neoplasm of colon, unspecified: Secondary | ICD-10-CM | POA: Diagnosis not present

## 2019-06-26 DIAGNOSIS — E785 Hyperlipidemia, unspecified: Secondary | ICD-10-CM | POA: Diagnosis not present

## 2019-07-03 DIAGNOSIS — H25013 Cortical age-related cataract, bilateral: Secondary | ICD-10-CM | POA: Diagnosis not present

## 2019-07-03 DIAGNOSIS — H31001 Unspecified chorioretinal scars, right eye: Secondary | ICD-10-CM | POA: Diagnosis not present

## 2019-07-03 DIAGNOSIS — H43392 Other vitreous opacities, left eye: Secondary | ICD-10-CM | POA: Diagnosis not present

## 2019-07-03 DIAGNOSIS — H524 Presbyopia: Secondary | ICD-10-CM | POA: Diagnosis not present

## 2019-07-03 DIAGNOSIS — H43812 Vitreous degeneration, left eye: Secondary | ICD-10-CM | POA: Diagnosis not present

## 2019-07-03 DIAGNOSIS — H2513 Age-related nuclear cataract, bilateral: Secondary | ICD-10-CM | POA: Diagnosis not present

## 2019-07-03 DIAGNOSIS — H04123 Dry eye syndrome of bilateral lacrimal glands: Secondary | ICD-10-CM | POA: Diagnosis not present

## 2019-08-20 DIAGNOSIS — I452 Bifascicular block: Secondary | ICD-10-CM | POA: Diagnosis not present

## 2019-08-20 DIAGNOSIS — I5189 Other ill-defined heart diseases: Secondary | ICD-10-CM | POA: Diagnosis not present

## 2019-08-20 DIAGNOSIS — E663 Overweight: Secondary | ICD-10-CM | POA: Diagnosis not present

## 2019-08-20 DIAGNOSIS — R0609 Other forms of dyspnea: Secondary | ICD-10-CM | POA: Diagnosis not present

## 2019-08-20 DIAGNOSIS — I10 Essential (primary) hypertension: Secondary | ICD-10-CM | POA: Diagnosis not present

## 2019-08-20 DIAGNOSIS — F419 Anxiety disorder, unspecified: Secondary | ICD-10-CM | POA: Diagnosis not present

## 2019-09-20 DIAGNOSIS — R9431 Abnormal electrocardiogram [ECG] [EKG]: Secondary | ICD-10-CM | POA: Insufficient documentation

## 2019-09-20 DIAGNOSIS — I5032 Chronic diastolic (congestive) heart failure: Secondary | ICD-10-CM | POA: Insufficient documentation

## 2019-09-20 DIAGNOSIS — Z7189 Other specified counseling: Secondary | ICD-10-CM | POA: Insufficient documentation

## 2019-09-20 NOTE — Progress Notes (Signed)
Cardiology Office Note   Date:  09/22/2019   ID:  Avalin, Pena [birth year], MRN [id number]  PCP:  Reynold Bowen, MD  Cardiologist:   Minus Breeding, MD  Chief Complaint  Patient presents with  . Abnormal ECG      History of Present Illness: Kristen Pena is a 74 y.o. female who presents for evaluaiton of an abnormal EKG . I saw her many years ago. She had a right bundle branch block. There was an episode of syncope at that time but a monitor was unremarkable. Echocardiogram was unremarkable. She has never had syncope since then. She was working out with a trainer the day to happen it was thought to be vasovagal. She does describe a treadmill which I cannot find but it was a hypertensive blood pressure response so I am treated her with hydrochlorothiazide. She never had any cardiac issues or need for follow-up after that. She is referred now because there is some extra skin obesity for this age and abnormal EKG. She does not really feel any palpitations but apparently these were found. She is not been as physically active with Covid but she lives on the third floor. There was 1 day when she was carrying multiple groceries up 3 flights with her mask on her she felt very short of breath when she got to the top. She had to sit down on the couch and recover. She recovers quickly and she has not had any problems since then. She is not had any chest pressure, neck or arm discomfort. She has no otherwise shortness of breath, PND or orthopnea.  Of note her previous echo did suggest some impaired relaxation and questionable prolapsing mitral valve without regurgitation.   Past Medical History:  Diagnosis Date  . Allergic rhinitis   . Hyperlipidemia   . Thyroid disease     Past Surgical History:  Procedure Laterality Date  . ABDOMINAL HYSTERECTOMY       Current Outpatient Medications  Medication Sig Dispense Refill  . Cholecalciferol (VITAMIN D PO) Take 1 tablet by mouth  daily.     Marland Kitchen ezetimibe (ZETIA) 10 MG tablet Take 10 mg by mouth daily.    Marland Kitchen Fexofenadine HCl (ALLEGRA PO) Take 1 tablet by mouth daily.     . hydrochlorothiazide (MICROZIDE) 12.5 MG capsule Take 12.5 mg by mouth daily.     . montelukast (SINGULAIR) 10 MG tablet Take 10 mg by mouth daily.     . rosuvastatin (CRESTOR) 10 MG tablet Take 10 mg by mouth 2 (two) times a week.     . Vitamin D, Ergocalciferol, (DRISDOL) 50000 units CAPS capsule Take 1 capsule by mouth 3 (three) times a week.  1   No current facility-administered medications for this visit.    Allergies:   Patient has no known allergies.    Social History:  The patient  reports that she has never smoked. She has never used smokeless tobacco. She reports current alcohol use. She reports that she does not use drugs.   Family History:  The patient's family history includes Colon cancer (age of onset: 109) in her mother; Dementia in her brother; Stomach cancer in her father.    ROS:  Please see the history of present illness.   Otherwise, review of systems are positive for none.   All other systems are reviewed and negative.    PHYSICAL EXAM: VS:  BP 117/67   Pulse 91   Ht 5\' 6"  (1.676 m)  Wt 152 lb 12.8 oz (69.3 kg)   SpO2 97%   BMI 24.66 kg/m  , BMI Body mass index is 24.66 kg/m. GENERAL:  Well appearing HEENT:  Pupils equal round and reactive, fundi not visualized, oral mucosa unremarkable NECK:  No jugular venous distention, waveform within normal limits, carotid upstroke brisk and symmetric, no bruits, no thyromegaly LYMPHATICS:  No cervical, inguinal adenopathy LUNGS:  Clear to auscultation bilaterally BACK:  No CVA tenderness CHEST:  Unremarkable HEART:  PMI not displaced or sustained,S1 and S2 within normal limits, no S3, no S4, no clicks, no rubs, no murmurs ABD:  Flat, positive bowel sounds normal in frequency in pitch, no bruits, no rebound, no guarding, no midline pulsatile mass, no hepatomegaly, no  splenomegaly EXT:  2 plus pulses throughout, no edema, no cyanosis no clubbing SKIN:  No rashes no nodules NEURO:  Cranial nerves II through XII grossly intact, motor grossly intact throughout PSYCH:  Cognitively intact, oriented to person place and time    EKG:  EKG is ordered today. The ekg ordered today demonstrates sinus rhythm, rate 86, right bundle branch block, left anterior fascicular block, left ventricular dysfunction Voltage criteria.   Recent Labs: No results found for requested labs within last 8760 hours.    Lipid Panel No results found for: CHOL, TRIG, HDL, CHOLHDL, VLDL, LDLCALC, LDLDIRECT    Wt Readings from Last 3 Encounters:  09/22/19 152 lb 12.8 oz (69.3 kg)  05/27/19 160 lb (72.6 kg)  04/18/17 155 lb (70.3 kg)      Other studies Reviewed: Additional studies/ records that were reviewed today include: Previous Holter and echo. Review of the above records demonstrates:  Please see elsewhere in the note.     ASSESSMENT AND PLAN:  ABNORMAL EKG:   She has a chronic right bundle branch block. I do not have any old EKG for noted to left anterior fascicular block is chronic. However, she has no symptoms related to this. I will check an echocardiogram. However, I doubt that any further testing will be necessary.  DOE: This will be evaluated as above. She otherwise has absolutely no symptoms. Further testing will be indicated.  COVID EDUCATION: She has had both doses of her vaccine.   Current medicines are reviewed at length with the patient today.  The patient does not have concerns regarding medicines.  The following changes have been made:  no change  Labs/ tests ordered today include:   Orders Placed This Encounter  Procedures  . EKG 12-Lead  . ECHOCARDIOGRAM COMPLETE     Disposition:   FU with me as needed.     Signed, Minus Breeding, MD  09/22/2019 5:15 PM    [facility]

## 2019-09-22 ENCOUNTER — Other Ambulatory Visit: Payer: Self-pay

## 2019-09-22 ENCOUNTER — Encounter: Payer: Self-pay | Admitting: Cardiology

## 2019-09-22 ENCOUNTER — Ambulatory Visit (INDEPENDENT_AMBULATORY_CARE_PROVIDER_SITE_OTHER): Payer: Medicare Other | Admitting: Cardiology

## 2019-09-22 VITALS — BP 117/67 | HR 91 | Ht 66.0 in | Wt 152.8 lb

## 2019-09-22 DIAGNOSIS — R9431 Abnormal electrocardiogram [ECG] [EKG]: Secondary | ICD-10-CM | POA: Diagnosis not present

## 2019-09-22 DIAGNOSIS — Z7189 Other specified counseling: Secondary | ICD-10-CM

## 2019-09-22 NOTE — Patient Instructions (Signed)
Medication Instructions:  No changes *If you need a refill on your cardiac medications before your next appointment, please call your pharmacy*  Lab Work: None ordered this visit  Testing/Procedures: Your physician has requested that you have an echocardiogram. Echocardiography is a painless test that uses sound waves to create images of your heart. It provides your doctor with information about the size and shape of your heart and how well your heart's chambers and valves are working. This procedure takes approximately one hour. There are no restrictions for this procedure. [place]  Follow-Up: At [facility], you and your health needs are our priority.  As part of our continuing mission to provide you with exceptional heart care, we have created designated Provider Care Teams.  These Care Teams include your primary Cardiologist (physician) and Advanced Practice Providers (APPs -  Physician Assistants and Nurse Practitioners) who all work together to provide you with the care you need, when you need it.   Other Instructions Follow up as needed

## 2019-09-23 DIAGNOSIS — H1045 Other chronic allergic conjunctivitis: Secondary | ICD-10-CM | POA: Diagnosis not present

## 2019-09-23 DIAGNOSIS — J3089 Other allergic rhinitis: Secondary | ICD-10-CM | POA: Diagnosis not present

## 2019-09-23 DIAGNOSIS — J301 Allergic rhinitis due to pollen: Secondary | ICD-10-CM | POA: Diagnosis not present

## 2019-09-23 DIAGNOSIS — J3081 Allergic rhinitis due to animal (cat) (dog) hair and dander: Secondary | ICD-10-CM | POA: Diagnosis not present

## 2019-10-01 ENCOUNTER — Other Ambulatory Visit: Payer: Self-pay

## 2019-10-01 ENCOUNTER — Ambulatory Visit (HOSPITAL_COMMUNITY): Payer: Medicare Other | Attending: Internal Medicine

## 2019-10-01 DIAGNOSIS — R9431 Abnormal electrocardiogram [ECG] [EKG]: Secondary | ICD-10-CM | POA: Diagnosis not present

## 2019-11-24 DIAGNOSIS — M1712 Unilateral primary osteoarthritis, left knee: Secondary | ICD-10-CM | POA: Diagnosis not present

## 2020-01-06 DIAGNOSIS — I10 Essential (primary) hypertension: Secondary | ICD-10-CM | POA: Diagnosis not present

## 2020-01-06 DIAGNOSIS — E7849 Other hyperlipidemia: Secondary | ICD-10-CM | POA: Diagnosis not present

## 2020-01-06 DIAGNOSIS — M859 Disorder of bone density and structure, unspecified: Secondary | ICD-10-CM | POA: Diagnosis not present

## 2020-01-06 DIAGNOSIS — E0520 Thyrotoxicosis with toxic multinodular goiter without thyrotoxic crisis or storm: Secondary | ICD-10-CM | POA: Diagnosis not present

## 2020-01-13 DIAGNOSIS — Z23 Encounter for immunization: Secondary | ICD-10-CM | POA: Diagnosis not present

## 2020-01-13 DIAGNOSIS — I10 Essential (primary) hypertension: Secondary | ICD-10-CM | POA: Diagnosis not present

## 2020-01-13 DIAGNOSIS — Z1339 Encounter for screening examination for other mental health and behavioral disorders: Secondary | ICD-10-CM | POA: Diagnosis not present

## 2020-01-13 DIAGNOSIS — D126 Benign neoplasm of colon, unspecified: Secondary | ICD-10-CM | POA: Diagnosis not present

## 2020-01-13 DIAGNOSIS — R82998 Other abnormal findings in urine: Secondary | ICD-10-CM | POA: Diagnosis not present

## 2020-01-13 DIAGNOSIS — Z0000 Encounter for general adult medical examination without abnormal findings: Secondary | ICD-10-CM | POA: Diagnosis not present

## 2020-01-13 DIAGNOSIS — I452 Bifascicular block: Secondary | ICD-10-CM | POA: Diagnosis not present

## 2020-01-13 DIAGNOSIS — K222 Esophageal obstruction: Secondary | ICD-10-CM | POA: Diagnosis not present

## 2020-01-13 DIAGNOSIS — I5189 Other ill-defined heart diseases: Secondary | ICD-10-CM | POA: Diagnosis not present

## 2020-01-13 DIAGNOSIS — E0520 Thyrotoxicosis with toxic multinodular goiter without thyrotoxic crisis or storm: Secondary | ICD-10-CM | POA: Diagnosis not present

## 2020-01-13 DIAGNOSIS — Z1331 Encounter for screening for depression: Secondary | ICD-10-CM | POA: Diagnosis not present

## 2020-01-21 DIAGNOSIS — Z1212 Encounter for screening for malignant neoplasm of rectum: Secondary | ICD-10-CM | POA: Diagnosis not present

## 2020-01-22 DIAGNOSIS — J309 Allergic rhinitis, unspecified: Secondary | ICD-10-CM | POA: Diagnosis not present

## 2020-01-22 DIAGNOSIS — R05 Cough: Secondary | ICD-10-CM | POA: Diagnosis not present

## 2020-01-22 DIAGNOSIS — Z1152 Encounter for screening for COVID-19: Secondary | ICD-10-CM | POA: Diagnosis not present

## 2020-03-14 DIAGNOSIS — J3081 Allergic rhinitis due to animal (cat) (dog) hair and dander: Secondary | ICD-10-CM | POA: Diagnosis not present

## 2020-03-14 DIAGNOSIS — J3089 Other allergic rhinitis: Secondary | ICD-10-CM | POA: Diagnosis not present

## 2020-03-14 DIAGNOSIS — J301 Allergic rhinitis due to pollen: Secondary | ICD-10-CM | POA: Diagnosis not present

## 2020-03-15 ENCOUNTER — Other Ambulatory Visit: Payer: Self-pay | Admitting: Obstetrics and Gynecology

## 2020-03-15 DIAGNOSIS — Z1231 Encounter for screening mammogram for malignant neoplasm of breast: Secondary | ICD-10-CM

## 2020-03-26 DIAGNOSIS — Z23 Encounter for immunization: Secondary | ICD-10-CM | POA: Diagnosis not present

## 2020-04-25 ENCOUNTER — Other Ambulatory Visit: Payer: Self-pay

## 2020-04-25 ENCOUNTER — Ambulatory Visit
Admission: RE | Admit: 2020-04-25 | Discharge: 2020-04-25 | Disposition: A | Discharge status: Home / Self Care | Payer: Medicare Other | Source: Ambulatory Visit | Attending: Obstetrics and Gynecology | Admitting: Obstetrics and Gynecology

## 2020-04-25 DIAGNOSIS — Z1231 Encounter for screening mammogram for malignant neoplasm of breast: Secondary | ICD-10-CM

## 2020-05-17 DIAGNOSIS — L821 Other seborrheic keratosis: Secondary | ICD-10-CM | POA: Diagnosis not present

## 2020-05-17 DIAGNOSIS — D225 Melanocytic nevi of trunk: Secondary | ICD-10-CM | POA: Diagnosis not present

## 2020-05-19 DIAGNOSIS — Z01419 Encounter for gynecological examination (general) (routine) without abnormal findings: Secondary | ICD-10-CM | POA: Diagnosis not present

## 2020-06-01 DIAGNOSIS — G894 Chronic pain syndrome: Secondary | ICD-10-CM | POA: Diagnosis not present

## 2020-06-21 DIAGNOSIS — M170 Bilateral primary osteoarthritis of knee: Secondary | ICD-10-CM | POA: Diagnosis not present

## 2020-07-07 DIAGNOSIS — H2513 Age-related nuclear cataract, bilateral: Secondary | ICD-10-CM | POA: Diagnosis not present

## 2020-07-07 DIAGNOSIS — H43812 Vitreous degeneration, left eye: Secondary | ICD-10-CM | POA: Diagnosis not present

## 2020-07-07 DIAGNOSIS — H31001 Unspecified chorioretinal scars, right eye: Secondary | ICD-10-CM | POA: Diagnosis not present

## 2020-07-07 DIAGNOSIS — H25013 Cortical age-related cataract, bilateral: Secondary | ICD-10-CM | POA: Diagnosis not present

## 2020-07-15 IMAGING — MG DIGITAL SCREENING BILAT W/ TOMO W/ CAD
8 series · 8 of 24 positions shown · non-contrast
Comparison: Previous exam(s).

CLINICAL DATA: Screening.

EXAM:
DIGITAL SCREENING BILATERAL MAMMOGRAM WITH TOMO AND CAD

[R CC synth-2D]
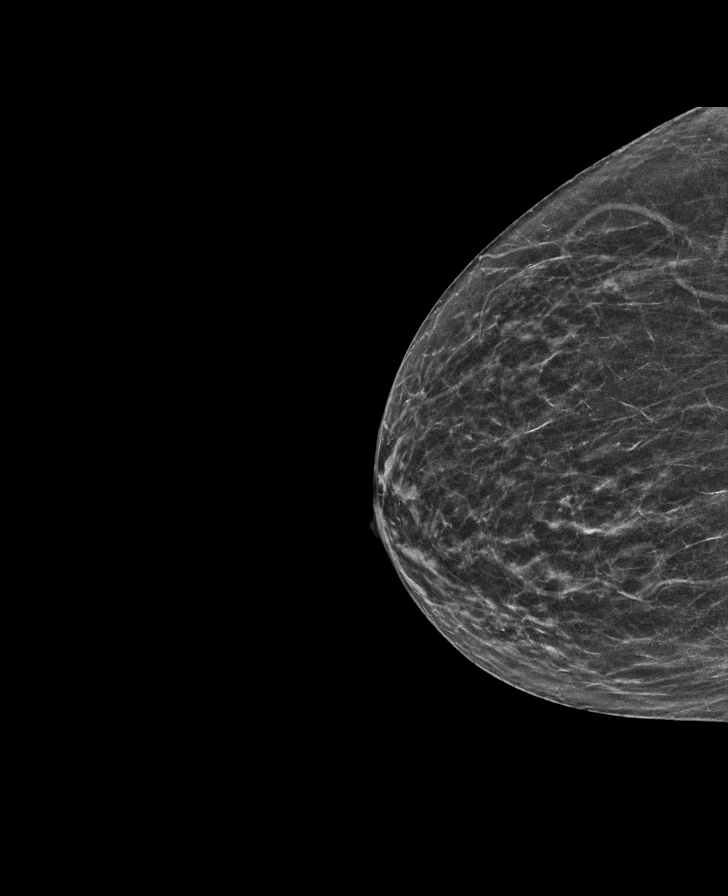

[R MLO synth-2D]
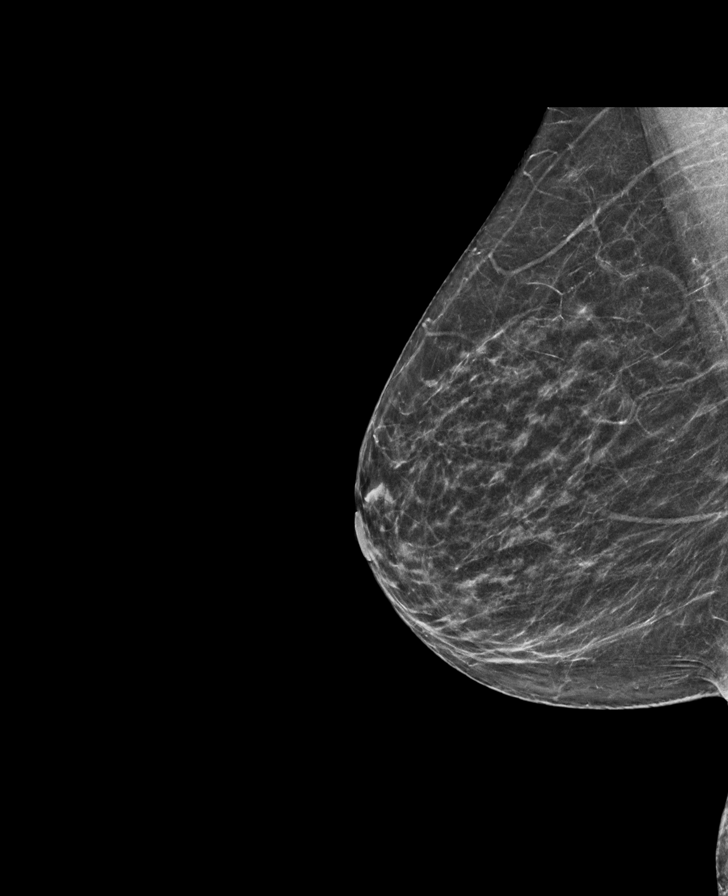

[L MLO synth-2D]
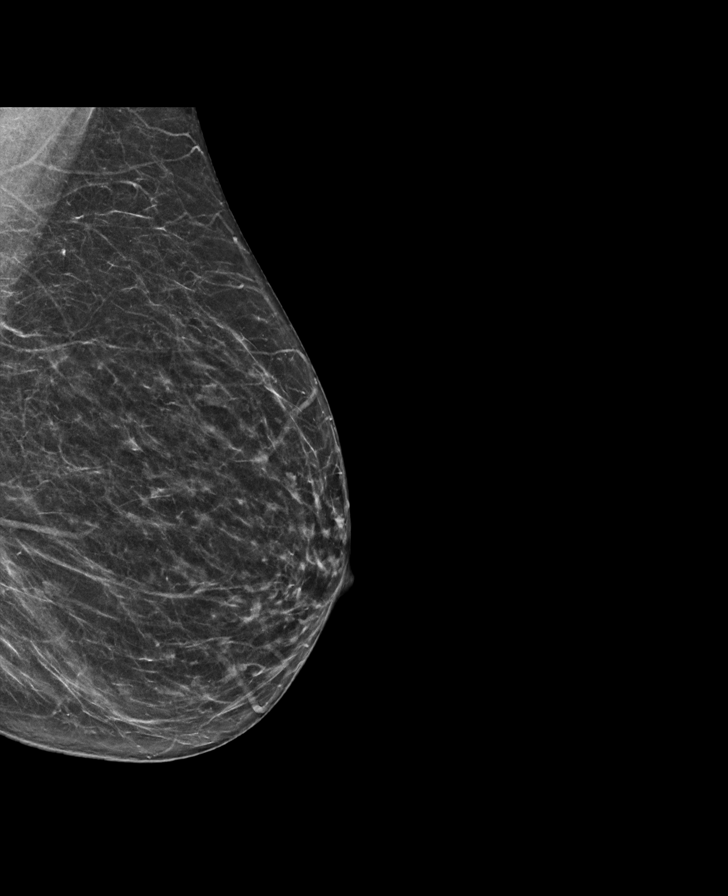

[L CC synth-2D]
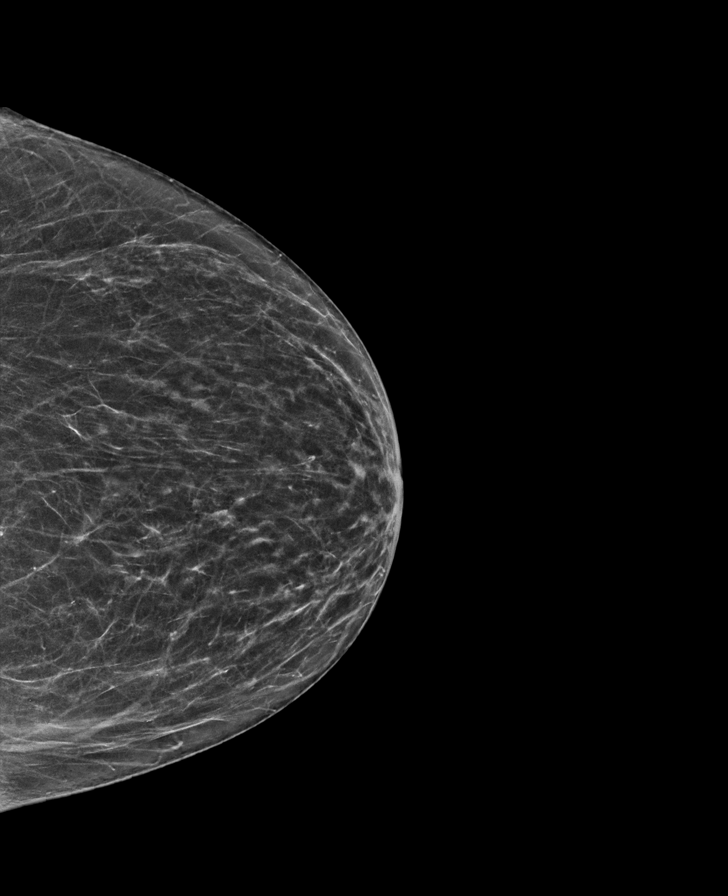

[L CC tomo · tomo slice 29/58.0]
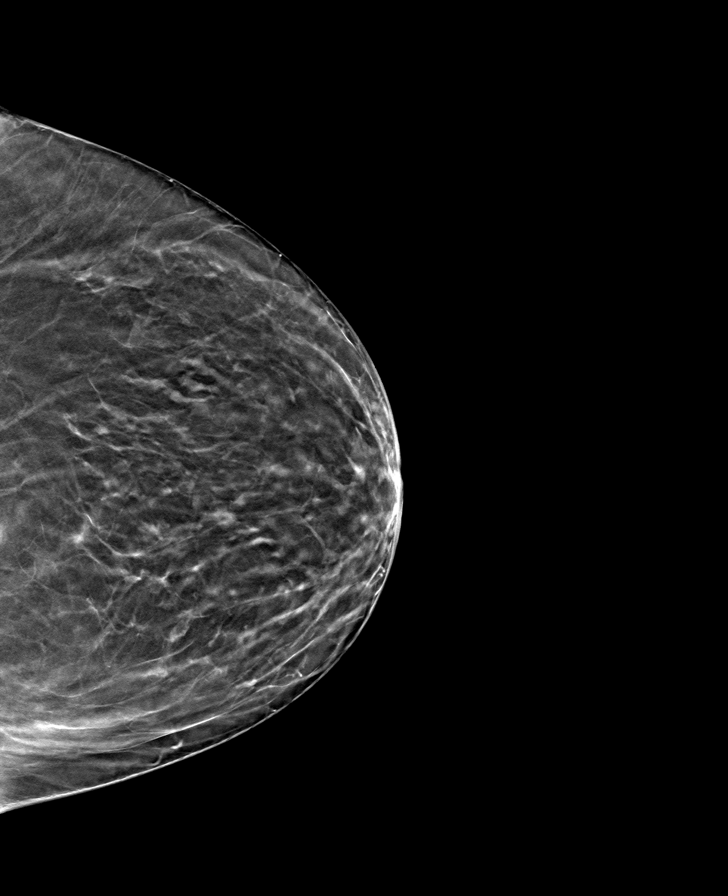

[R MLO tomo · tomo slice 29/56.0]
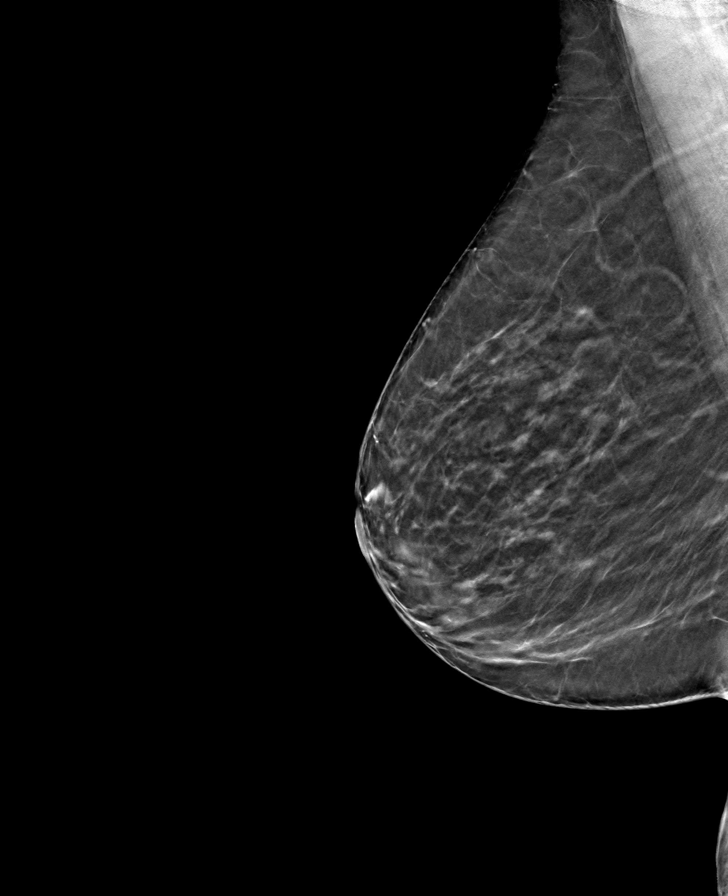

[L MLO tomo · tomo slice 31/60.0]
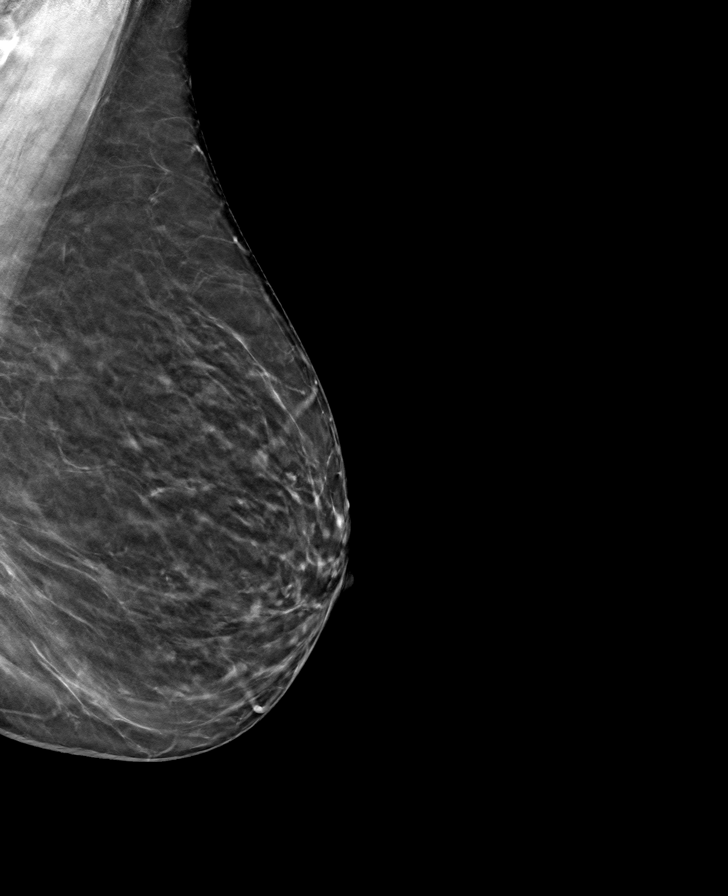

[R CC tomo · tomo slice 27/54.0]
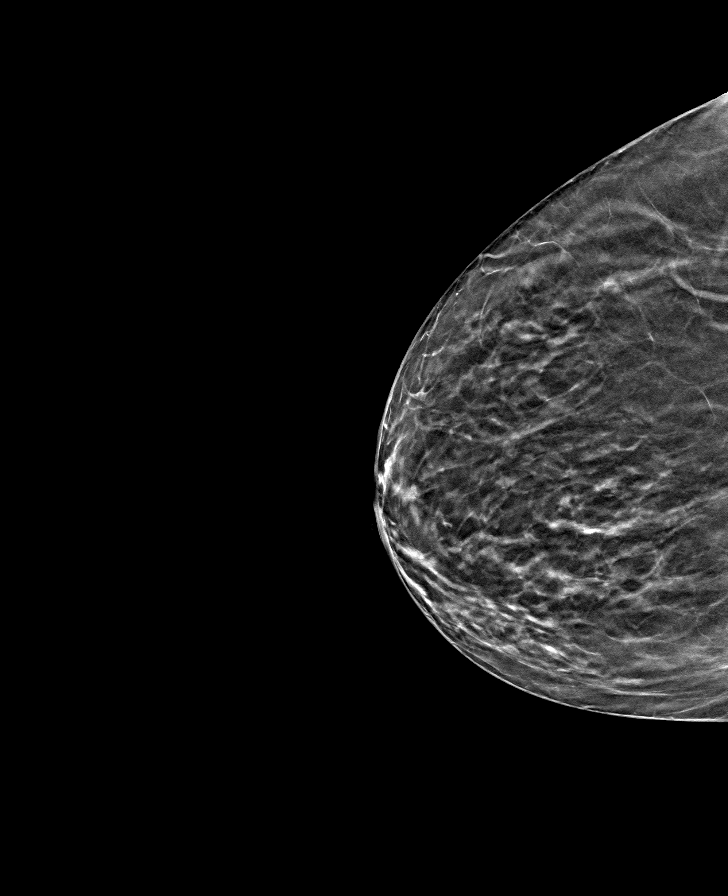

[8 of 24 positions shown; findings below may reference images not displayed]

ACR Breast Density Category b: There are scattered areas of
fibroglandular density.
FINDINGS: In the left breast, a possible asymmetry warrants further
evaluation. In the right breast, no findings suspicious for
malignancy. Images were processed with CAD.
IMPRESSION: Further evaluation is suggested for possible asymmetry in the left
breast.

RECOMMENDATION:
Diagnostic mammogram and possibly ultrasound of the left breast.
(Code:DI-5-LL4)

The patient will be contacted regarding the findings, and additional
imaging will be scheduled.

BI-RADS CATEGORY  0: Incomplete. Need additional imaging evaluation
and/or prior mammograms for comparison.

## 2020-07-21 DIAGNOSIS — E785 Hyperlipidemia, unspecified: Secondary | ICD-10-CM | POA: Diagnosis not present

## 2020-07-21 DIAGNOSIS — E0520 Thyrotoxicosis with toxic multinodular goiter without thyrotoxic crisis or storm: Secondary | ICD-10-CM | POA: Diagnosis not present

## 2020-07-26 DIAGNOSIS — D126 Benign neoplasm of colon, unspecified: Secondary | ICD-10-CM | POA: Diagnosis not present

## 2020-07-26 DIAGNOSIS — E559 Vitamin D deficiency, unspecified: Secondary | ICD-10-CM | POA: Diagnosis not present

## 2020-07-26 DIAGNOSIS — K222 Esophageal obstruction: Secondary | ICD-10-CM | POA: Diagnosis not present

## 2020-07-26 DIAGNOSIS — E785 Hyperlipidemia, unspecified: Secondary | ICD-10-CM | POA: Diagnosis not present

## 2020-07-26 DIAGNOSIS — I10 Essential (primary) hypertension: Secondary | ICD-10-CM | POA: Diagnosis not present

## 2020-07-26 DIAGNOSIS — E0520 Thyrotoxicosis with toxic multinodular goiter without thyrotoxic crisis or storm: Secondary | ICD-10-CM | POA: Diagnosis not present

## 2020-07-26 DIAGNOSIS — F419 Anxiety disorder, unspecified: Secondary | ICD-10-CM | POA: Diagnosis not present

## 2020-09-20 DIAGNOSIS — R0600 Dyspnea, unspecified: Secondary | ICD-10-CM | POA: Insufficient documentation

## 2020-09-20 DIAGNOSIS — R0609 Other forms of dyspnea: Secondary | ICD-10-CM | POA: Insufficient documentation

## 2020-09-20 NOTE — Progress Notes (Signed)
Cardiology Office Note   Date:  09/21/2020   ID:  Kristen Pena, Kristen Pena 05-03-1946, MRN [id number]  PCP:  Reynold Bowen, MD  Cardiologist:   Minus Breeding, MD  No chief complaint on file.     History of Present Illness: Kristen Pena is a 75 y.o. female who presents for evaluaiton of an abnormal EKG . She had a right bundle branch block. There was an episode of syncope at that time but a monitor was unremarkable. Echocardiogram was unremarkable. It was thought to be vasovagal.  Of note her previous echo did suggest some impaired relaxation and questionable prolapsing mitral valve without regurgitation.  However, MR was not evident when I repeated this last year. She had only mild LVH.    Since I last saw her she has had no new problems.  She was getting some shortness of breath.  Groceries up flights of stairs.  She is not had any new shortness of breath, PND or orthopnea.  She is not had any new palpitations, presyncope or syncope.  She had no weight gain or edema.   Past Medical History:  Diagnosis Date  . Allergic rhinitis   . Hyperlipidemia   . Thyroid disease     Past Surgical History:  Procedure Laterality Date  . ABDOMINAL HYSTERECTOMY       Current Outpatient Medications  Medication Sig Dispense Refill  . Cholecalciferol (VITAMIN D PO) Take 1 tablet by mouth daily.     Marland Kitchen ezetimibe (ZETIA) 10 MG tablet Take 10 mg by mouth daily.    Marland Kitchen Fexofenadine HCl (ALLEGRA PO) Take 1 tablet by mouth daily.     . hydrochlorothiazide (MICROZIDE) 12.5 MG capsule Take 12.5 mg by mouth daily.     . montelukast (SINGULAIR) 10 MG tablet Take 10 mg by mouth daily.     . rosuvastatin (CRESTOR) 10 MG tablet Take 10 mg by mouth 2 (two) times a week.     . Vitamin D, Ergocalciferol, (DRISDOL) 50000 units CAPS capsule Take 1 capsule by mouth 3 (three) times a week.  1  . Fexofenadine HCl (ALLEGRA ALLERGY PO) Allegra Allergy     No current facility-administered medications for this  visit.    Allergies:   Patient has no known allergies.   ROS:  Please see the history of present illness.   Otherwise, review of systems are positive for none.   All other systems are reviewed and negative.    PHYSICAL EXAM: VS:  BP (!) 142/82   Pulse 67   Ht 5\' 6"  (1.676 m)   Wt 158 lb 6.4 oz (71.8 kg)   SpO2 99%   BMI 25.57 kg/m  , BMI Body mass index is 25.57 kg/m. GENERAL:  Well appearing NECK:  No jugular venous distention, waveform within normal limits, carotid upstroke brisk and symmetric, questionable bruits, no thyromegaly LUNGS:  Clear to auscultation bilaterally CHEST:  Unremarkable HEART:  PMI not displaced or sustained,S1 and S2 within normal limits, no S3, no S4, no clicks, no rubs, no murmurs ABD:  Flat, positive bowel sounds normal in frequency in pitch, no bruits, no rebound, no guarding, no midline pulsatile mass, no hepatomegaly, no splenomegaly EXT:  2 plus pulses throughout, no edema, no cyanosis no clubbing   EKG:  EKG is  ordered today. The ekg ordered today demonstrates sinus rhythm, rate 67, right bundle branch block, left anterior fascicular block.  Premature atrial contractions   Recent Labs: No results found for requested labs  within last 8760 hours.    Lipid Panel No results found for: CHOL, TRIG, HDL, CHOLHDL, VLDL, LDLCALC, LDLDIRECT    Wt Readings from Last 3 Encounters:  09/21/20 158 lb 6.4 oz (71.8 kg)  09/22/19 152 lb 12.8 oz (69.3 kg)  05/27/19 160 lb (72.6 kg)      Other studies Reviewed: Additional studies/ records that were reviewed today include: labs  Review of the above records demonstrates:  Please see elsewhere in the note.     ASSESSMENT AND PLAN:  ABNORMAL EKG:   She has a chronic right bundle branch block.  She is not had any presyncope or syncope associated with this.  She understands the physiology and to let me know if this ever happens.  Otherwise no change in therapy at this point.  DOE:   I will check a  coronary calcium score.  I do not strongly suspect any obstructive coronary disease however.  BRUIT: I will check carotid Dopplers.  HTN: Her blood pressure is mildly elevated but this is unusual.  She is going to keep a blood pressure diary.  DYSLIPIDEMIA: The LDL was 77 and the HDL 78.  No change in therapy.    Current medicines are reviewed at length with the patient today.  The patient does not have concerns regarding medicines.  The following changes have been made:  None  Labs/ tests ordered today include:    No orders of the defined types were placed in this encounter.    Disposition:   FU with me in one year.    Signed, Minus Breeding, MD  09/21/2020 11:48 AM    [place]

## 2020-09-21 ENCOUNTER — Ambulatory Visit (INDEPENDENT_AMBULATORY_CARE_PROVIDER_SITE_OTHER): Payer: Medicare Other | Admitting: Cardiology

## 2020-09-21 ENCOUNTER — Other Ambulatory Visit: Payer: Self-pay

## 2020-09-21 ENCOUNTER — Encounter: Payer: Self-pay | Admitting: Cardiology

## 2020-09-21 VITALS — BP 142/82 | HR 67 | Ht 66.0 in | Wt 158.4 lb

## 2020-09-21 DIAGNOSIS — R0989 Other specified symptoms and signs involving the circulatory and respiratory systems: Secondary | ICD-10-CM | POA: Diagnosis not present

## 2020-09-21 DIAGNOSIS — R9431 Abnormal electrocardiogram [ECG] [EKG]: Secondary | ICD-10-CM

## 2020-09-21 DIAGNOSIS — R0609 Other forms of dyspnea: Secondary | ICD-10-CM

## 2020-09-21 DIAGNOSIS — R0600 Dyspnea, unspecified: Secondary | ICD-10-CM | POA: Diagnosis not present

## 2020-09-21 NOTE — Patient Instructions (Signed)
Medication Instructions:  No changes *If you need a refill on your cardiac medications before your next appointment, please call your pharmacy*   Lab Work: None ordered If you have labs (blood work) drawn today and your tests are completely normal, you will receive your results only by: Marland Kitchen MyChart Message (if you have MyChart) OR . A paper copy in the mail If you have any lab test that is abnormal or we need to change your treatment, we will call you to review the results.   Testing/Procedures: Your physician has requested that you have a carotid duplex. This test is an ultrasound of the carotid arteries in your neck. It looks at blood flow through these arteries that supply the brain with blood. Allow one hour for this exam. There are no restrictions or special instructions. This will take place at [place], [address].  Dr. Percival Spanish has ordered a CT coronary calcium score. This test is done at [address]. This is $99 out of pocket.   Coronary CalciumScan A coronary calcium scan is an imaging test used to look for deposits of calcium and other fatty materials (plaques) in the inner lining of the blood vessels of the heart (coronary arteries). These deposits of calcium and plaques can partly clog and narrow the coronary arteries without producing any symptoms or warning signs. This puts a person at risk for a heart attack. This test can detect these deposits before symptoms develop. Tell a health care provider about:  Any allergies you have.  All medicines you are taking, including vitamins, herbs, eye drops, creams, and over-the-counter medicines.  Any problems you or family members have had with anesthetic medicines.  Any blood disorders you have.  Any surgeries you have had.  Any medical conditions you have.  Whether you are pregnant or may be pregnant. What are the risks? Generally, this is a safe procedure. However, problems may occur,  including:  Harm to a pregnant woman and her unborn baby. This test involves the use of radiation. Radiation exposure can be dangerous to a pregnant woman and her unborn baby. If you are pregnant, you generally should not have this procedure done.  Slight increase in the risk of cancer. This is because of the radiation involved in the test. What happens before the procedure? No preparation is needed for this procedure. What happens during the procedure?  You will undress and remove any jewelry around your neck or chest.  You will put on a hospital gown.  Sticky electrodes will be placed on your chest. The electrodes will be connected to an electrocardiogram (ECG) machine to record a tracing of the electrical activity of your heart.  A CT scanner will take pictures of your heart. During this time, you will be asked to lie still and hold your breath for 2-3 seconds while a picture of your heart is being taken. The procedure may vary among health care providers and hospitals. What happens after the procedure?  You can get dressed.  You can return to your normal activities.  It is up to you to get the results of your test. Ask your health care provider, or the department that is doing the test, when your results will be ready. Summary  A coronary calcium scan is an imaging test used to look for deposits of calcium and other fatty materials (plaques) in the inner lining of the blood vessels of the heart (coronary arteries).  Generally, this is a safe  procedure. Tell your health care provider if you are pregnant or may be pregnant.  No preparation is needed for this procedure.  A CT scanner will take pictures of your heart.  You can return to your normal activities after the scan is done. This information is not intended to replace advice given to you by your health care provider. Make sure you discuss any questions you have with your health care provider. Document Released: 12/01/2007  Document Revised: 04/23/2016 Document Reviewed: 04/23/2016 Elsevier Interactive Patient Education  2017 [place]: At [facility], you and your health needs are our priority.  As part of our continuing mission to provide you with exceptional heart care, we have created designated Provider Care Teams.  These Care Teams include your primary Cardiologist (physician) and Advanced Practice Providers (APPs -  Physician Assistants and Nurse Practitioners) who all work together to provide you with the care you need, when you need it.  We recommend signing up for the patient portal called "MyChart".  Sign up information is provided on this After Visit Summary.  MyChart is used to connect with patients for Virtual Visits (Telemedicine).  Patients are able to view lab/test results, encounter notes, upcoming appointments, etc.  Non-urgent messages can be sent to your provider as well.   To learn more about what you can do with MyChart, go to [email or web address].    Your next appointment:   12 month(s)  The format for your next appointment:   In Person  Provider:   You may see Minus Breeding, MD or one of the following Advanced Practice Providers on your designated Care Team:    Rosaria Ferries, PA-C  Jory Sims, DNP, ANP    Other Instructions Please look into buying an Omron blood pressure cuff. You may get this at a local drug store.

## 2020-09-22 DIAGNOSIS — H1045 Other chronic allergic conjunctivitis: Secondary | ICD-10-CM | POA: Diagnosis not present

## 2020-09-22 DIAGNOSIS — J3089 Other allergic rhinitis: Secondary | ICD-10-CM | POA: Diagnosis not present

## 2020-09-22 DIAGNOSIS — J301 Allergic rhinitis due to pollen: Secondary | ICD-10-CM | POA: Diagnosis not present

## 2020-09-22 DIAGNOSIS — J3081 Allergic rhinitis due to animal (cat) (dog) hair and dander: Secondary | ICD-10-CM | POA: Diagnosis not present

## 2020-09-26 ENCOUNTER — Other Ambulatory Visit: Payer: Self-pay

## 2020-09-26 ENCOUNTER — Ambulatory Visit (AMBULATORY_SURGERY_CENTER): Payer: Self-pay | Admitting: *Deleted

## 2020-09-26 VITALS — Ht 66.0 in | Wt 155.8 lb

## 2020-09-26 DIAGNOSIS — Z800 Family history of malignant neoplasm of digestive organs: Secondary | ICD-10-CM

## 2020-09-26 DIAGNOSIS — Z860100 Personal history of colon polyps, unspecified: Secondary | ICD-10-CM

## 2020-09-26 MED ORDER — SUPREP BOWEL PREP KIT 17.5-3.13-1.6 GM/177ML PO SOLN: 1.0000 | Freq: Once | ORAL | 0 refills | Status: AC

## 2020-09-26 NOTE — Progress Notes (Signed)
No egg or soy allergy known to patient  No issues with past sedation with any surgeries or procedures Patient denies ever being told they had issues or difficulty with intubation  No FH of Malignant Hyperthermia No diet pills per patient No home 02 use per patient  No blood thinners per patient  Pt denies issues with constipation  No A fib or A flutter  EMMI video to pt or via [place] 19 guidelines implemented in [place] today with Pt and RN  Pt is fully vaccinated  for Covid  In person PV today   Due to the COVID-19 pandemic we are asking patients to follow certain guidelines.  Pt aware of COVID protocols and [facility] guidelines                 . Pt given the option in PV today for Golytely prep verses  alternative prep  ( Suprep/Plenvu)-  Pt is aware the Golytely has more volume but is more cost effective and the Suprep/Plenvu is less volume but may cost $40-150.  Pt voiced understanding of this and choose Suprep  Prep.

## 2020-10-10 ENCOUNTER — Encounter: Payer: Self-pay | Admitting: Gastroenterology

## 2020-10-10 ENCOUNTER — Other Ambulatory Visit: Payer: Self-pay

## 2020-10-10 ENCOUNTER — Ambulatory Visit (AMBULATORY_SURGERY_CENTER): Payer: Medicare Other | Admitting: Gastroenterology

## 2020-10-10 VITALS — BP 124/62 | HR 70 | Temp 98.0°F | Resp 20 | Ht 66.0 in | Wt 155.0 lb

## 2020-10-10 DIAGNOSIS — Z800 Family history of malignant neoplasm of digestive organs: Secondary | ICD-10-CM | POA: Diagnosis not present

## 2020-10-10 DIAGNOSIS — E785 Hyperlipidemia, unspecified: Secondary | ICD-10-CM | POA: Diagnosis not present

## 2020-10-10 DIAGNOSIS — D123 Benign neoplasm of transverse colon: Secondary | ICD-10-CM | POA: Diagnosis not present

## 2020-10-10 DIAGNOSIS — Z86010 Personal history of colonic polyps: Secondary | ICD-10-CM | POA: Diagnosis not present

## 2020-10-10 DIAGNOSIS — I10 Essential (primary) hypertension: Secondary | ICD-10-CM | POA: Diagnosis not present

## 2020-10-10 DIAGNOSIS — Z860101 Personal history of adenomatous and serrated colon polyps: Secondary | ICD-10-CM

## 2020-10-10 MED ORDER — SODIUM CHLORIDE 0.9 % IV SOLN: 500.0000 mL | Freq: Once | INTRAVENOUS | Status: DC

## 2020-10-10 NOTE — Progress Notes (Addendum)
Called to room to assist during endoscopic procedure.  Patient ID and intended procedure confirmed with present staff. Received instructions for my participation in the procedure from the performing physician.  Transverse polyp removed cold snare but unable to retrieve. No other polyps removed.

## 2020-10-10 NOTE — Progress Notes (Signed)
VS-CW  Pt's states no medical or surgical changes since previsit or office visit.  

## 2020-10-10 NOTE — Patient Instructions (Signed)
YOU HAD AN ENDOSCOPIC PROCEDURE TODAY AT THE [facility]:   Refer to the procedure report that was given to you for any specific questions about what was found during the examination.  If the procedure report does not answer your questions, please call your gastroenterologist to clarify.  If you requested that your care partner not be given the details of your procedure findings, then the procedure report has been included in a sealed envelope for you to review at your convenience later.  YOU SHOULD EXPECT: Some feelings of bloating in the abdomen. Passage of more gas than usual.  Walking can help get rid of the air that was put into your GI tract during the procedure and reduce the bloating. If you had a lower endoscopy (such as a colonoscopy or flexible sigmoidoscopy) you may notice spotting of blood in your stool or on the toilet paper. If you underwent a bowel prep for your procedure, you may not have a normal bowel movement for a few days.  Please Note:  You might notice some irritation and congestion in your nose or some drainage.  This is from the oxygen used during your procedure.  There is no need for concern and it should clear up in a day or so.  SYMPTOMS TO REPORT IMMEDIATELY:   Following lower endoscopy (colonoscopy or flexible sigmoidoscopy):  Excessive amounts of blood in the stool  Significant tenderness or worsening of abdominal pains  Swelling of the abdomen that is new, acute  Fever of 100F or higher   Following upper endoscopy (EGD)  Vomiting of blood or coffee ground material  New chest pain or pain under the shoulder blades  Painful or persistently difficult swallowing  New shortness of breath  Fever of 100F or higher  Black, tarry-looking stools  For urgent or emergent issues, a gastroenterologist can be reached at any hour by calling [phone]. Do not use MyChart messaging for urgent concerns.    DIET:  We do recommend a small meal at first, but  then you may proceed to your regular diet.  Drink plenty of fluids but you should avoid alcoholic beverages for 24 hours.  ACTIVITY:  You should plan to take it easy for the rest of today and you should NOT DRIVE or use heavy machinery until tomorrow (because of the sedation medicines used during the test).    FOLLOW UP: Our staff will call the number listed on your records 48-72 hours following your procedure to check on you and address any questions or concerns that you may have regarding the information given to you following your procedure. If we do not reach you, we will leave a message.  We will attempt to reach you two times.  During this call, we will ask if you have developed any symptoms of COVID 19. If you develop any symptoms (ie: fever, flu-like symptoms, shortness of breath, cough etc.) before then, please call [phone].  If you test positive for Covid 19 in the 2 weeks post procedure, please call and report this information to us.    If any biopsies were taken you will be contacted by phone or by letter within the next 1-3 weeks.  Please call us at [phone] if you have not heard about the biopsies in 3 weeks.    SIGNATURES/CONFIDENTIALITY: You and/or your care partner have signed paperwork which will be entered into your electronic medical record.  These signatures attest to the fact that that the information above on   your After Visit Summary has been reviewed and is understood.  Full responsibility of the confidentiality of this discharge information lies with you and/or your care-partner. 

## 2020-10-10 NOTE — Progress Notes (Signed)
Report to PACU, RN, vss, BBS= Clear.  

## 2020-10-10 NOTE — Op Note (Signed)
[place] Patient Name: Kristen Pena Procedure Date: 10/10/2020 7:59 AM MRN: [id number] Endoscopist: Ladene Artist , MD Age: 75 Referring MD:  Date of Birth: [birth year] Gender: Female Account #: [id number] Procedure:                Colonoscopy Indications:              Surveillance: Personal history of adenomatous                            polyps on last colonoscopy 5 years ago. Family                            history of colon cancer, first degree relative. Medicines:                Monitored Anesthesia Care Procedure:                Pre-Anesthesia Assessment:                           - Prior to the procedure, a History and Physical                            was performed, and patient medications and                            allergies were reviewed. The patient's tolerance of                            previous anesthesia was also reviewed. The risks                            and benefits of the procedure and the sedation                            options and risks were discussed with the patient.                            All questions were answered, and informed consent                            was obtained. Prior Anticoagulants: The patient has                            taken no previous anticoagulant or antiplatelet                            agents. ASA Grade Assessment: II - A patient with                            mild systemic disease. After reviewing the risks                            and benefits, the patient was deemed in  satisfactory condition to undergo the procedure.                           After obtaining informed consent, the colonoscope                            was passed under direct vision. Throughout the                            procedure, the patient's blood pressure, pulse, and                            oxygen saturations were monitored continuously. The                            Olympus  [id number]-[id number]) Colonoscope was                            introduced through the anus and advanced to the the                            cecum, identified by appendiceal orifice and                            ileocecal valve. The ileocecal valve, appendiceal                            orifice, and rectum were photographed. The quality                            of the bowel preparation was excellent. The                            colonoscopy was performed without difficulty. The                            patient tolerated the procedure well. Scope In: 8:04:09 AM Scope Out: 8:21:56 AM Scope Withdrawal Time: 0 hours 12 minutes 17 seconds  Total Procedure Duration: 0 hours 17 minutes 47 seconds  Findings:                 The perianal and digital rectal examinations were                            normal.                           A 6 mm polyp was found in the transverse colon. The                            polyp was sessile. The polyp was removed with a                            cold snare. Resection and not retrieved.  Internal hemorrhoids were found during                            retroflexion. The hemorrhoids were small and Grade                            I (internal hemorrhoids that do not prolapse).                           The exam was otherwise without abnormality on                            direct and retroflexion views. Complications:            No immediate complications. Estimated blood loss:                            None. Estimated Blood Loss:     Estimated blood loss: none. Impression:               - One 6 mm polyp in the transverse colon, removed                            with a cold snare. Resected and not retrieved.                           - Internal hemorrhoids.                           - The examination was otherwise normal on direct                            and retroflexion views. Recommendation:           - Patient has  a contact number available for                            emergencies. The signs and symptoms of potential                            delayed complications were discussed with the                            patient. Return to normal activities tomorrow.                            Written discharge instructions were provided to the                            patient.                           - Resume previous diet.                           - Continue present medications.                           -  No repeat colonoscopy due to age. Ladene Artist, MD 10/10/2020 8:25:53 AM This report has been signed electronically.

## 2020-10-12 ENCOUNTER — Telehealth: Payer: Self-pay | Admitting: *Deleted

## 2020-10-12 NOTE — Telephone Encounter (Signed)
  Follow up Call-  Call back number 10/10/2020  Post procedure Call Back phone  # [phone]  Permission to leave phone message Yes  Some recent data might be hidden     Patient questions:  Do you have a fever, pain , or abdominal swelling? No. Pain Score  0 *  Have you tolerated food without any problems? Yes.    Have you been able to return to your normal activities? Yes.    Do you have any questions about your discharge instructions: Diet   No. Medications  No. Follow up visit  No.  Do you have questions or concerns about your Care? No.  Actions: * If pain score is 4 or above: No action needed, pain <4.  1. Have you developed a fever since your procedure? no  2.   Have you had an respiratory symptoms (SOB or cough) since your procedure? no  3.   Have you tested positive for COVID 19 since your procedure no  4.   Have you had any family members/close contacts diagnosed with the COVID 19 since your procedure?  no   If yes to any of these questions please route to Joylene John, RN and Joella Prince, RN

## 2020-10-12 NOTE — Telephone Encounter (Signed)
  Follow up Call-  Call back number 10/10/2020  Post procedure Call Back phone  # [phone]  Permission to leave phone message Yes  Some recent data might be hidden     First attempt for follow up phone call. No answer at number given.  Left message on voicemail.

## 2020-10-22 DIAGNOSIS — Z23 Encounter for immunization: Secondary | ICD-10-CM | POA: Diagnosis not present

## 2020-10-24 ENCOUNTER — Ambulatory Visit (HOSPITAL_COMMUNITY)
Admission: RE | Admit: 2020-10-24 | Discharge: 2020-10-24 | Disposition: A | Discharge status: Home / Self Care | Payer: Medicare Other | Source: Ambulatory Visit | Attending: Internal Medicine | Admitting: Internal Medicine

## 2020-10-24 ENCOUNTER — Other Ambulatory Visit: Payer: Self-pay

## 2020-10-24 DIAGNOSIS — R0989 Other specified symptoms and signs involving the circulatory and respiratory systems: Secondary | ICD-10-CM | POA: Insufficient documentation

## 2020-10-27 ENCOUNTER — Other Ambulatory Visit: Payer: Self-pay

## 2020-10-27 ENCOUNTER — Ambulatory Visit (INDEPENDENT_AMBULATORY_CARE_PROVIDER_SITE_OTHER)
Admission: RE | Admit: 2020-10-27 | Discharge: 2020-10-27 | Disposition: A | Discharge status: Home / Self Care | Payer: Self-pay | Source: Ambulatory Visit | Attending: Cardiology | Admitting: Cardiology

## 2020-10-27 DIAGNOSIS — R9431 Abnormal electrocardiogram [ECG] [EKG]: Secondary | ICD-10-CM

## 2020-10-31 ENCOUNTER — Telehealth: Payer: Self-pay | Admitting: *Deleted

## 2020-10-31 MED ORDER — ROSUVASTATIN CALCIUM 10 MG PO TABS
ORAL_TABLET | ORAL | 3 refills | Status: DC
Start: 2020-10-31 — End: 2021-08-16

## 2020-10-31 NOTE — Telephone Encounter (Signed)
Advised patient, verbalized understanding  

## 2020-10-31 NOTE — Telephone Encounter (Signed)
-----   Message from Minus Breeding, MD sent at 10/29/2020  8:30 AM EDT ----- There is very mild calcium in the LAD.  I would continue the statin with a goal therapy of LDL less than 70.  Could she take the Crestor at least 5 days per week? Call [name] with the results and send results to Reynold Bowen, MD

## 2020-12-05 DIAGNOSIS — J3089 Other allergic rhinitis: Secondary | ICD-10-CM | POA: Diagnosis not present

## 2020-12-12 DIAGNOSIS — U071 COVID-19: Secondary | ICD-10-CM | POA: Diagnosis not present

## 2020-12-12 DIAGNOSIS — J3089 Other allergic rhinitis: Secondary | ICD-10-CM | POA: Diagnosis not present

## 2020-12-12 DIAGNOSIS — J189 Pneumonia, unspecified organism: Secondary | ICD-10-CM | POA: Diagnosis not present

## 2021-01-02 DIAGNOSIS — J189 Pneumonia, unspecified organism: Secondary | ICD-10-CM | POA: Diagnosis not present

## 2021-01-17 DIAGNOSIS — E785 Hyperlipidemia, unspecified: Secondary | ICD-10-CM | POA: Diagnosis not present

## 2021-01-17 DIAGNOSIS — R7989 Other specified abnormal findings of blood chemistry: Secondary | ICD-10-CM | POA: Diagnosis not present

## 2021-01-17 DIAGNOSIS — E0520 Thyrotoxicosis with toxic multinodular goiter without thyrotoxic crisis or storm: Secondary | ICD-10-CM | POA: Diagnosis not present

## 2021-01-23 DIAGNOSIS — E0520 Thyrotoxicosis with toxic multinodular goiter without thyrotoxic crisis or storm: Secondary | ICD-10-CM | POA: Diagnosis not present

## 2021-01-23 DIAGNOSIS — I2584 Coronary atherosclerosis due to calcified coronary lesion: Secondary | ICD-10-CM | POA: Diagnosis not present

## 2021-01-23 DIAGNOSIS — I700 Atherosclerosis of aorta: Secondary | ICD-10-CM | POA: Diagnosis not present

## 2021-01-23 DIAGNOSIS — M8580 Other specified disorders of bone density and structure, unspecified site: Secondary | ICD-10-CM | POA: Diagnosis not present

## 2021-01-23 DIAGNOSIS — I2510 Atherosclerotic heart disease of native coronary artery without angina pectoris: Secondary | ICD-10-CM | POA: Diagnosis not present

## 2021-01-23 DIAGNOSIS — E785 Hyperlipidemia, unspecified: Secondary | ICD-10-CM | POA: Diagnosis not present

## 2021-01-23 DIAGNOSIS — I6522 Occlusion and stenosis of left carotid artery: Secondary | ICD-10-CM | POA: Diagnosis not present

## 2021-01-23 DIAGNOSIS — E559 Vitamin D deficiency, unspecified: Secondary | ICD-10-CM | POA: Diagnosis not present

## 2021-01-23 DIAGNOSIS — F419 Anxiety disorder, unspecified: Secondary | ICD-10-CM | POA: Diagnosis not present

## 2021-01-23 DIAGNOSIS — I10 Essential (primary) hypertension: Secondary | ICD-10-CM | POA: Diagnosis not present

## 2021-01-23 DIAGNOSIS — D126 Benign neoplasm of colon, unspecified: Secondary | ICD-10-CM | POA: Diagnosis not present

## 2021-03-13 ENCOUNTER — Other Ambulatory Visit: Payer: Self-pay | Admitting: Endocrinology

## 2021-03-13 DIAGNOSIS — Z1231 Encounter for screening mammogram for malignant neoplasm of breast: Secondary | ICD-10-CM

## 2021-03-16 DIAGNOSIS — Z23 Encounter for immunization: Secondary | ICD-10-CM | POA: Diagnosis not present

## 2021-04-08 DIAGNOSIS — Z23 Encounter for immunization: Secondary | ICD-10-CM | POA: Diagnosis not present

## 2021-04-25 DIAGNOSIS — M545 Low back pain, unspecified: Secondary | ICD-10-CM | POA: Diagnosis not present

## 2021-04-27 ENCOUNTER — Other Ambulatory Visit: Payer: Self-pay

## 2021-04-27 ENCOUNTER — Ambulatory Visit
Admission: RE | Admit: 2021-04-27 | Discharge: 2021-04-27 | Disposition: A | Discharge status: Home / Self Care | Payer: Medicare Other | Source: Ambulatory Visit | Attending: Endocrinology | Admitting: Endocrinology

## 2021-04-27 DIAGNOSIS — Z1231 Encounter for screening mammogram for malignant neoplasm of breast: Secondary | ICD-10-CM | POA: Diagnosis not present

## 2021-05-18 DIAGNOSIS — L814 Other melanin hyperpigmentation: Secondary | ICD-10-CM | POA: Diagnosis not present

## 2021-05-18 DIAGNOSIS — L821 Other seborrheic keratosis: Secondary | ICD-10-CM | POA: Diagnosis not present

## 2021-05-18 DIAGNOSIS — D2371 Other benign neoplasm of skin of right lower limb, including hip: Secondary | ICD-10-CM | POA: Diagnosis not present

## 2021-05-19 DIAGNOSIS — M5451 Vertebrogenic low back pain: Secondary | ICD-10-CM | POA: Diagnosis not present

## 2021-05-24 DIAGNOSIS — M5451 Vertebrogenic low back pain: Secondary | ICD-10-CM | POA: Diagnosis not present

## 2021-05-29 DIAGNOSIS — M5451 Vertebrogenic low back pain: Secondary | ICD-10-CM | POA: Diagnosis not present

## 2021-06-01 DIAGNOSIS — M5451 Vertebrogenic low back pain: Secondary | ICD-10-CM | POA: Diagnosis not present

## 2021-06-06 DIAGNOSIS — M5451 Vertebrogenic low back pain: Secondary | ICD-10-CM | POA: Diagnosis not present

## 2021-06-08 DIAGNOSIS — M5451 Vertebrogenic low back pain: Secondary | ICD-10-CM | POA: Diagnosis not present

## 2021-06-22 DIAGNOSIS — M5451 Vertebrogenic low back pain: Secondary | ICD-10-CM | POA: Diagnosis not present

## 2021-07-20 ENCOUNTER — Ambulatory Visit (INDEPENDENT_AMBULATORY_CARE_PROVIDER_SITE_OTHER): Payer: Medicare Other | Admitting: Podiatry

## 2021-07-20 ENCOUNTER — Encounter: Payer: Self-pay | Admitting: Podiatry

## 2021-07-20 ENCOUNTER — Ambulatory Visit (INDEPENDENT_AMBULATORY_CARE_PROVIDER_SITE_OTHER): Payer: Medicare Other

## 2021-07-20 ENCOUNTER — Other Ambulatory Visit: Payer: Self-pay

## 2021-07-20 DIAGNOSIS — M21611 Bunion of right foot: Secondary | ICD-10-CM

## 2021-07-20 DIAGNOSIS — B351 Tinea unguium: Secondary | ICD-10-CM

## 2021-07-20 DIAGNOSIS — M21619 Bunion of unspecified foot: Secondary | ICD-10-CM | POA: Diagnosis not present

## 2021-07-20 DIAGNOSIS — S99921A Unspecified injury of right foot, initial encounter: Secondary | ICD-10-CM

## 2021-07-21 DIAGNOSIS — E0520 Thyrotoxicosis with toxic multinodular goiter without thyrotoxic crisis or storm: Secondary | ICD-10-CM | POA: Diagnosis not present

## 2021-07-21 DIAGNOSIS — E559 Vitamin D deficiency, unspecified: Secondary | ICD-10-CM | POA: Diagnosis not present

## 2021-07-21 DIAGNOSIS — I10 Essential (primary) hypertension: Secondary | ICD-10-CM | POA: Diagnosis not present

## 2021-07-21 DIAGNOSIS — E785 Hyperlipidemia, unspecified: Secondary | ICD-10-CM | POA: Diagnosis not present

## 2021-07-21 NOTE — Progress Notes (Signed)
Subjective:   Patient ID: Kristen Pena, female   DOB: 76 y.o.   MRN: [id number]   HPI Patient presents stating she traumatized the right second nail on her right foot and its been sore and making it hard for her to wear shoe gear and also her nails are thickened and she is concerned about fungal formation.  Patient does not smoke likes to be active   Review of Systems  All other systems reviewed and are negative.      Objective:  Physical Exam Vitals and nursing note reviewed.  Constitutional:      Appearance: She is well-developed.  Pulmonary:     Effort: Pulmonary effort is normal.  Musculoskeletal:        General: Normal range of motion.  Skin:    General: Skin is warm.  Neurological:     Mental Status: She is alert.    Neurovascular status found to be intact muscle strength is adequate range of motion mildly reduced subtalar midtarsal joint.  Patient has a damaged second nail right partially detached with discoloration and has thickened nails bilateral with yellow like infiltration.  Good digital perfusion well oriented     Assessment:  Damaged right second nail with looseness of the bed with overall mycotic nail infection     Plan:  H&P reviewed both conditions and for the right I have recommended nail removal explaining procedure and risk and allowing it to regrow explaining it may not grow normally and ultimately may require permanent procedure.  Today I went ahead and I infiltrated the right second toe 60 mg like Marcaine mixture sterile prep done and using sterile instrumentation removed the second nail found to be healthy flush the area I did not see damage to nail bed and applied sterile dressing.  Discussed fungus do not recommend further treatment will be seen back as needed

## 2021-07-26 ENCOUNTER — Other Ambulatory Visit: Payer: Self-pay | Admitting: Podiatry

## 2021-07-26 DIAGNOSIS — M21619 Bunion of unspecified foot: Secondary | ICD-10-CM

## 2021-07-28 DIAGNOSIS — M8580 Other specified disorders of bone density and structure, unspecified site: Secondary | ICD-10-CM | POA: Diagnosis not present

## 2021-07-28 DIAGNOSIS — I2584 Coronary atherosclerosis due to calcified coronary lesion: Secondary | ICD-10-CM | POA: Diagnosis not present

## 2021-07-28 DIAGNOSIS — E785 Hyperlipidemia, unspecified: Secondary | ICD-10-CM | POA: Diagnosis not present

## 2021-07-28 DIAGNOSIS — E559 Vitamin D deficiency, unspecified: Secondary | ICD-10-CM | POA: Diagnosis not present

## 2021-07-28 DIAGNOSIS — I10 Essential (primary) hypertension: Secondary | ICD-10-CM | POA: Diagnosis not present

## 2021-07-28 DIAGNOSIS — F419 Anxiety disorder, unspecified: Secondary | ICD-10-CM | POA: Diagnosis not present

## 2021-07-28 DIAGNOSIS — I6522 Occlusion and stenosis of left carotid artery: Secondary | ICD-10-CM | POA: Diagnosis not present

## 2021-07-28 DIAGNOSIS — D126 Benign neoplasm of colon, unspecified: Secondary | ICD-10-CM | POA: Diagnosis not present

## 2021-07-28 DIAGNOSIS — R7989 Other specified abnormal findings of blood chemistry: Secondary | ICD-10-CM | POA: Diagnosis not present

## 2021-07-28 DIAGNOSIS — I700 Atherosclerosis of aorta: Secondary | ICD-10-CM | POA: Diagnosis not present

## 2021-07-28 DIAGNOSIS — Z1331 Encounter for screening for depression: Secondary | ICD-10-CM | POA: Diagnosis not present

## 2021-07-28 DIAGNOSIS — E0520 Thyrotoxicosis with toxic multinodular goiter without thyrotoxic crisis or storm: Secondary | ICD-10-CM | POA: Diagnosis not present

## 2021-08-16 ENCOUNTER — Other Ambulatory Visit: Payer: Self-pay | Admitting: Cardiology

## 2021-08-16 DIAGNOSIS — M170 Bilateral primary osteoarthritis of knee: Secondary | ICD-10-CM | POA: Diagnosis not present

## 2021-08-16 DIAGNOSIS — M1712 Unilateral primary osteoarthritis, left knee: Secondary | ICD-10-CM | POA: Diagnosis not present

## 2021-09-12 DIAGNOSIS — Q141 Congenital malformation of retina: Secondary | ICD-10-CM | POA: Diagnosis not present

## 2021-09-12 DIAGNOSIS — H25813 Combined forms of age-related cataract, bilateral: Secondary | ICD-10-CM | POA: Diagnosis not present

## 2021-09-12 DIAGNOSIS — H11153 Pinguecula, bilateral: Secondary | ICD-10-CM | POA: Diagnosis not present

## 2021-09-12 DIAGNOSIS — H04123 Dry eye syndrome of bilateral lacrimal glands: Secondary | ICD-10-CM | POA: Diagnosis not present

## 2021-09-23 ENCOUNTER — Encounter (HOSPITAL_BASED_OUTPATIENT_CLINIC_OR_DEPARTMENT_OTHER): Payer: Self-pay | Admitting: Emergency Medicine

## 2021-09-23 ENCOUNTER — Emergency Department (HOSPITAL_BASED_OUTPATIENT_CLINIC_OR_DEPARTMENT_OTHER)
Admission: EM | Admit: 2021-09-23 | Discharge: 2021-09-23 | Disposition: A | Discharge status: Home / Self Care | Payer: Medicare Other | Attending: Emergency Medicine | Admitting: Emergency Medicine

## 2021-09-23 ENCOUNTER — Other Ambulatory Visit: Payer: Self-pay

## 2021-09-23 ENCOUNTER — Emergency Department (HOSPITAL_BASED_OUTPATIENT_CLINIC_OR_DEPARTMENT_OTHER): Payer: Medicare Other

## 2021-09-23 DIAGNOSIS — R079 Chest pain, unspecified: Secondary | ICD-10-CM

## 2021-09-23 DIAGNOSIS — K219 Gastro-esophageal reflux disease without esophagitis: Secondary | ICD-10-CM | POA: Diagnosis not present

## 2021-09-23 DIAGNOSIS — M25512 Pain in left shoulder: Secondary | ICD-10-CM | POA: Diagnosis not present

## 2021-09-23 DIAGNOSIS — R0789 Other chest pain: Secondary | ICD-10-CM | POA: Diagnosis not present

## 2021-09-23 LAB — CBC WITH DIFFERENTIAL/PLATELET
Abs Immature Granulocytes: 0.01 10*3/uL (ref 0.00–0.07)
Basophils Absolute: 0 10*3/uL (ref 0.0–0.1)
Basophils Relative: 0 %
Eosinophils Absolute: 0 10*3/uL (ref 0.0–0.5)
Eosinophils Relative: 1 %
HCT: 45.6 % (ref 36.0–46.0)
Hemoglobin: 15.1 g/dL — ABNORMAL HIGH (ref 12.0–15.0)
Immature Granulocytes: 0 %
Lymphocytes Relative: 57 %
Lymphs Abs: 3.6 10*3/uL (ref 0.7–4.0)
MCH: 30 pg (ref 26.0–34.0)
MCHC: 33.1 g/dL (ref 30.0–36.0)
MCV: 90.7 fL (ref 80.0–100.0)
Monocytes Absolute: 0.5 10*3/uL (ref 0.1–1.0)
Monocytes Relative: 7 %
Neutro Abs: 2.2 10*3/uL (ref 1.7–7.7)
Neutrophils Relative %: 35 %
Platelets: 154 10*3/uL (ref 150–400)
RBC: 5.03 MIL/uL (ref 3.87–5.11)
RDW: 13.2 % (ref 11.5–15.5)
WBC: 6.3 10*3/uL (ref 4.0–10.5)
nRBC: 0 % (ref 0.0–0.2)

## 2021-09-23 LAB — BASIC METABOLIC PANEL
Anion gap: 8 (ref 5–15)
BUN: 22 mg/dL (ref 8–23)
CO2: 31 mmol/L (ref 22–32)
Calcium: 10 mg/dL (ref 8.9–10.3)
Chloride: 104 mmol/L (ref 98–111)
Creatinine, Ser: 0.99 mg/dL (ref 0.44–1.00)
GFR, Estimated: 59 mL/min — ABNORMAL LOW (ref >60)
Glucose, Bld: 99 mg/dL (ref 70–99)
Potassium: 3.5 mmol/L (ref 3.5–5.1)
Sodium: 143 mmol/L (ref 135–145)

## 2021-09-23 LAB — TROPONIN I (HIGH SENSITIVITY)
Troponin I (High Sensitivity): 5 ng/L (ref <18)
Troponin I (High Sensitivity): 6 ng/L (ref <18)

## 2021-09-23 LAB — MAGNESIUM: Magnesium: 2 mg/dL (ref 1.7–2.4)

## 2021-09-23 MED ORDER — FAMOTIDINE IN NACL 20-0.9 MG/50ML-% IV SOLN
20.0000 mg | Freq: Once | INTRAVENOUS | Status: AC
  Administered 2021-09-23: 20 mg via INTRAVENOUS
  Filled 2021-09-23: qty 50

## 2021-09-23 MED ORDER — FAMOTIDINE 20 MG PO TABS: 20.0000 mg | ORAL_TABLET | Freq: Two times a day (BID) | ORAL | 0 refills | Status: DC

## 2021-09-23 MED ORDER — KETOROLAC TROMETHAMINE 15 MG/ML IJ SOLN
15.0000 mg | Freq: Once | INTRAMUSCULAR | Status: AC
Start: 2021-09-23 — End: 2021-09-23
  Administered 2021-09-23: 15 mg via INTRAVENOUS
  Filled 2021-09-23: qty 1

## 2021-09-23 MED ORDER — ALUM & MAG HYDROXIDE-SIMETH 200-200-20 MG/5ML PO SUSP
15.0000 mL | Freq: Once | ORAL | Status: AC
  Administered 2021-09-23: 15 mL via ORAL
  Filled 2021-09-23: qty 30

## 2021-09-23 MED ORDER — OMEPRAZOLE 20 MG PO CPDR: 20.0000 mg | DELAYED_RELEASE_CAPSULE | Freq: Every day | ORAL | 0 refills | Status: AC

## 2021-09-23 NOTE — ED Triage Notes (Signed)
Pt awoke and thought she had some gas, but her ususal tea did not relieve. She states pain is in her left shoulder and left neck.  ?

## 2021-09-23 NOTE — ED Provider Notes (Signed)
?[facility] EMERGENCY DEPT ?Provider Note ? ? ?CSN: [id number] ?Arrival date & time: 09/23/21  0930 ? ?  ? ?History ? ?Chief Complaint  ?Patient presents with  ? Arm Pain  ?  Arm pain  ? ? ?Kristen Pena is a 76 y.o. female. ? ?Pt is a 76 yo female presenting for left shoulder pain. Pt admits to left shoulder pain that she noticed upon waking this morning. Radiation to left neck. Denies chest pain or sob. States she thought it was gas pain but did not relieve. Admits to hx of GERD/acid reflux.  ? ?The history is provided by the patient. No language interpreter was used.  ?Arm Pain ?Pertinent negatives include no chest pain, no abdominal pain and no shortness of breath.  ? ?  ? ?Home Medications ?Prior to Admission medications   ?Medication Sig Start Date End Date Taking? Authorizing Provider  ?EPINEPHrine 0.3 mg/0.3 mL IJ SOAJ injection See admin instructions. 03/02/20   [provider]  ?ezetimibe (ZETIA) 10 MG tablet Take 10 mg by mouth daily.    [provider]  ?Fexofenadine HCl (ALLEGRA PO) Take 1 tablet by mouth daily.     [provider]  ?fluticasone (FLONASE SENSIMIST) 27.5 MCG/SPRAY nasal spray 1-2 sprays each nostril    [provider]  ?hydrochlorothiazide (MICROZIDE) 12.5 MG capsule Take 12.5 mg by mouth daily.  02/24/15   [provider]  ?montelukast (SINGULAIR) 10 MG tablet Take 10 mg by mouth daily.  03/04/15   [provider]  ?rosuvastatin (CRESTOR) 10 MG tablet TAKE ONE TABLET BY MOUTH ONE TIME DAILY AT NIGHT EXCEPT TUESDAY AND FRIDAY 08/16/21   Minus Breeding, MD  ?Vitamin D, Ergocalciferol, (DRISDOL) 50000 units CAPS capsule Take 1 capsule by mouth every 7 (seven) days. 06/29/16   [provider]  ?   ? ?Allergies    ?Patient has no known allergies.   ? ?Review of Systems   ?Review of Systems  ?Constitutional:  Negative for chills and fever.  ?HENT:  Negative for ear pain and sore throat.   ?Eyes:  Negative for pain and  visual disturbance.  ?Respiratory:  Negative for cough and shortness of breath.   ?Cardiovascular:  Negative for chest pain and palpitations.  ?Gastrointestinal:  Negative for abdominal pain and vomiting.  ?Genitourinary:  Negative for dysuria and hematuria.  ?Musculoskeletal:  Negative for arthralgias and back pain.  ?Skin:  Negative for color change and rash.  ?Neurological:  Negative for seizures and syncope.  ?All other systems reviewed and are negative. ? ?Physical Exam ?Updated Vital Signs ?BP (!) 148/72 (BP Location: Left Arm)   Pulse (!) 56   Temp 97.7 ?F (36.5 ?C)   Resp 18   SpO2 100%  ?Physical Exam ?Vitals and nursing note reviewed.  ?Constitutional:   ?   General: She is not in acute distress. ?   Appearance: She is well-developed.  ?HENT:  ?   Head: Normocephalic and atraumatic.  ?Eyes:  ?   Conjunctiva/sclera: Conjunctivae normal.  ?Cardiovascular:  ?   Rate and Rhythm: Normal rate and regular rhythm.  ?   Heart sounds: No murmur heard. ?Pulmonary:  ?   Effort: Pulmonary effort is normal. No respiratory distress.  ?   Breath sounds: Normal breath sounds.  ?Abdominal:  ?   Palpations: Abdomen is soft.  ?   Tenderness: There is no abdominal tenderness.  ?Musculoskeletal:     ?   General: No swelling.  ?   Cervical  back: Neck supple.  ?Skin: ?   General: Skin is warm and dry.  ?   Capillary Refill: Capillary refill takes less than 2 seconds.  ?Neurological:  ?   Mental Status: She is alert.  ?Psychiatric:     ?   Mood and Affect: Mood normal.  ? ? ?ED Results / Procedures / Treatments   ?Labs ?(all labs ordered are listed, but only abnormal results are displayed) ?Labs Reviewed  ?CBC WITH DIFFERENTIAL/PLATELET  ?BASIC METABOLIC PANEL  ?MAGNESIUM  ?TROPONIN I (HIGH SENSITIVITY)  ? ? ?EKG ?None ? ?Radiology ?No results found. ? ?Procedures ?Procedures  ? ? ?Medications Ordered in ED ?Medications  ?famotidine (PEPCID) IVPB 20 mg premix (has no administration in time range)  ?alum & mag hydroxide-simeth  (MAALOX/MYLANTA) 200-200-20 MG/5ML suspension 15 mL (has no administration in time range)  ? ? ?ED Course/ Medical Decision Making/ A&P ?  ?                        ?Medical Decision Making ?Amount and/or Complexity of Data Reviewed ?Labs: ordered. ?Radiology: ordered. ? ?Risk ?OTC drugs. ?Prescription drug management. ? ? ?11:20 AM ?77 yo female presenting for left shoulder pain. Patient is Aox3, no acute distress, afebrile, with stable vitals. No reproducible arm or shoulder pain.  Considered cardiac etiology, gallbladder pathology, musculoskeletal pain. ? ?ECG as interpreted by myself demonstrates no ST segment ovation depression.  Stable intervals.  Stable rate.  Troponin within normal limits x2.  Doubt cardiac etiology at this time however recommend patient follows up closely with cardiologist in the next week if symptoms continue. ? ?Patient also has a soft abdomen with no tenderness in the right upper quadrant.  Doubt gallbladder pathology. ? ?Patient does admit to a history of GERD.  Pepcid and Maalox given with significant improvement of symptoms.  Patient recommended for omeprazole use over the next 2 weeks. ? ?Patient in no distress and overall condition improved here in the ED. Detailed discussions were had with the patient regarding current findings, and need for close f/u with PCP or on call doctor. The patient has been instructed to return immediately if the symptoms worsen in any way for re-evaluation. Patient verbalized understanding and is in agreement with current care plan. All questions answered prior to discharge. ? ? ? ? ? ? ? ?Final Clinical Impression(s) / ED Diagnoses ?Final diagnoses:  ?Chest pain, unspecified type  ?Gastroesophageal reflux disease, unspecified whether esophagitis present  ? ? ?Rx / DC Orders ?ED Discharge Orders   ? ? None  ? ?  ? ? ?  ?Lianne Cure, DO ?18/84/16 [id number] ? ?

## 2021-09-27 DIAGNOSIS — M170 Bilateral primary osteoarthritis of knee: Secondary | ICD-10-CM | POA: Diagnosis not present

## 2021-10-05 DIAGNOSIS — J3089 Other allergic rhinitis: Secondary | ICD-10-CM | POA: Diagnosis not present

## 2021-10-05 DIAGNOSIS — J3081 Allergic rhinitis due to animal (cat) (dog) hair and dander: Secondary | ICD-10-CM | POA: Diagnosis not present

## 2021-10-05 DIAGNOSIS — J301 Allergic rhinitis due to pollen: Secondary | ICD-10-CM | POA: Diagnosis not present

## 2021-10-05 DIAGNOSIS — H1045 Other chronic allergic conjunctivitis: Secondary | ICD-10-CM | POA: Diagnosis not present

## 2021-10-06 DIAGNOSIS — M170 Bilateral primary osteoarthritis of knee: Secondary | ICD-10-CM | POA: Diagnosis not present

## 2021-10-16 DIAGNOSIS — E785 Hyperlipidemia, unspecified: Secondary | ICD-10-CM | POA: Insufficient documentation

## 2021-10-16 DIAGNOSIS — I10 Essential (primary) hypertension: Secondary | ICD-10-CM | POA: Insufficient documentation

## 2021-10-16 DIAGNOSIS — R072 Precordial pain: Secondary | ICD-10-CM | POA: Insufficient documentation

## 2021-10-16 NOTE — Progress Notes (Signed)
?  ?Cardiology Office Note ? ? ?Date:  10/17/2021  ? ?ID:  Kristen Pena, DOB 08-02-1945, MRN [id number] ? ?PCP:  Reynold Bowen, MD  ?Cardiologist:   Minus Breeding, MD ? ?Chief Complaint  ?Patient presents with  ? Chest Pain  ? ? ?  ?History of Present Illness: ?Kristen Pena is a 76 y.o. female who presents for evaluaiton of an abnormal EKG . She had a right bundle branch block. There was an episode of syncope at that time but a monitor was unremarkable. Echocardiogram was unremarkable. It was thought to be vasovagal.  Of note her previous echo did suggest some impaired relaxation and questionable prolapsing mitral valve without regurgitation.  However, MR was not evident when I repeated this last year. She had only mild LVH.    A calcium score in May of last year was minimally elevated.  ? ?She was in the ED in early April with chest pain.  I reviewed these records for this visit.   This was not thought to be cardiac.  She said she stopped eating chocolate and this helped with that pain.  Has not been getting it.  She walks 3 flights of stairs routinely. The patient denies any new symptoms such as chest discomfort, neck or arm discomfort. There has been no new shortness of breath, PND or orthopnea. There have been no reported palpitations, presyncope or syncope. ? ? ? ?Past Medical History:  ?Diagnosis Date  ? Allergic rhinitis   ? Allergy   ? Arthritis   ? left knee   ? Cataract   ? forming   ? GERD (gastroesophageal reflux disease)   ? PAST hx- diet controlled   ? Hyperlipidemia   ? Hypertension   ? Controlled on HCTZ  ? RBBB   ? Thyroid disease   ? ? ?Past Surgical History:  ?Procedure Laterality Date  ? ABDOMINAL HYSTERECTOMY    ? COLONOSCOPY    ? POLYPECTOMY    ? UPPER GASTROINTESTINAL ENDOSCOPY    ? ? ? ?Current Outpatient Medications  ?Medication Sig Dispense Refill  ? ezetimibe (ZETIA) 10 MG tablet Take 10 mg by mouth daily.    ? famotidine (PEPCID) 20 MG tablet Take 1 tablet (20 mg total) by mouth  2 (two) times daily. 30 tablet 0  ? Fexofenadine HCl (ALLEGRA PO) Take 1 tablet by mouth daily.     ? fluticasone (FLONASE SENSIMIST) 27.5 MCG/SPRAY nasal spray 1-2 sprays each nostril    ? hydrochlorothiazide (MICROZIDE) 12.5 MG capsule Take 12.5 mg by mouth daily.     ? montelukast (SINGULAIR) 10 MG tablet Take 10 mg by mouth daily.     ? omeprazole (PRILOSEC) 20 MG capsule Take 1 capsule (20 mg total) by mouth daily for 14 days. 14 capsule 0  ? rosuvastatin (CRESTOR) 10 MG tablet TAKE ONE TABLET BY MOUTH ONE TIME DAILY AT NIGHT EXCEPT TUESDAY AND FRIDAY 75 tablet 1  ? Vitamin D, Ergocalciferol, (DRISDOL) 50000 units CAPS capsule Take 1 capsule by mouth every 7 (seven) days.  1  ? ?No current facility-administered medications for this visit.  ? ? ?Allergies:   Patient has no known allergies.  ? ?ROS:  Please see the history of present illness.   Otherwise, review of systems are positive for none.   All other systems are reviewed and negative.  ? ? ?PHYSICAL EXAM: ?VS:  BP 128/78   Pulse 72   Ht '5\' 6"'$  (1.676 m)   Wt 161 lb (73  kg)   SpO2 98%   BMI 25.99 kg/m?  , BMI Body mass index is 25.99 kg/m?. ?GENERAL:  Well appearing ?NECK:  No jugular venous distention, waveform within normal limits, carotid upstroke brisk and symmetric, no bruits, no thyromegaly ?LUNGS:  Clear to auscultation bilaterally ?CHEST:  Unremarkable ?HEART:  PMI not displaced or sustained,S1 and S2 within normal limits, no S3, no S4, no clicks, no rubs, no murmurs ?ABD:  Flat, positive bowel sounds normal in frequency in pitch, no bruits, no rebound, no guarding, no midline pulsatile mass, no hepatomegaly, no splenomegaly ?EXT:  2 plus pulses throughout, no edema, no cyanosis no clubbing ? ? ?EKG:  EKG is not ordered today. ?The ekg ordered September 23, 2021 demonstrates sinus rhythm, rate 56, right bundle branch block, left anterior fascicular block.  Premature atrial contractions ? ? ?Recent Labs: ?09/23/2021: BUN 22; Creatinine, Ser 0.99;  Hemoglobin 15.1; Magnesium 2.0; Platelets 154; Potassium 3.5; Sodium 143  ? ? ?Lipid Panel ?No results found for: CHOL, TRIG, HDL, CHOLHDL, VLDL, LDLCALC, LDLDIRECT ?  ? ?Wt Readings from Last 3 Encounters:  ?10/17/21 161 lb (73 kg)  ?10/10/20 155 lb (70.3 kg)  ?09/26/20 155 lb 12.8 oz (70.7 kg)  ?  ? ? ?Other studies Reviewed: ?Additional studies/ records that were reviewed today include: Labs ?Review of the above records demonstrates:  Please see elsewhere in the note.   ? ? ?ASSESSMENT AND PLAN: ? ?CHEST PAIN:   This likely was GI.  It has resolved.  She has some mild coronary calcium that we talked about but no further testing is indicated.  She will continue with primary risk reduction. ? ?BRUIT:   She had only mild carotid plaque last year.  No further imaging. ? ?HTN: Her blood pressure is at target.  She will check this at home as it was elevated in the ER and was keep an eye on it.  ? ?DYSLIPIDEMIA: The LDL was 83 but her HDL was 76.  No change in therapy.  ? ?BIFASICULAR BLOCK: She has not had syncope in years.  She does not feel the PACs that she is having.  She has no presyncope.  We talked about this and she would let me know if that ever happens.  No change in therapy.  Has ? ?Current medicines are reviewed at length with the patient today.  The patient does not have concerns regarding medicines. ? ?The following changes have been made:  None ? ?Labs/ tests ordered today include:  None ? ?No orders of the defined types were placed in this encounter. ? ? ? ?Disposition:   FU with me in 12 months.  ? ? ?Signed, ?Minus Breeding, MD  ?10/17/2021 2:12 PM    ?[place] ? ? ?

## 2021-10-17 ENCOUNTER — Encounter: Payer: Self-pay | Admitting: Cardiology

## 2021-10-17 ENCOUNTER — Ambulatory Visit (INDEPENDENT_AMBULATORY_CARE_PROVIDER_SITE_OTHER): Payer: 59 | Admitting: Cardiology

## 2021-10-17 VITALS — BP 128/78 | HR 72 | Ht 66.0 in | Wt 161.0 lb

## 2021-10-17 DIAGNOSIS — R072 Precordial pain: Secondary | ICD-10-CM | POA: Diagnosis not present

## 2021-10-17 DIAGNOSIS — I10 Essential (primary) hypertension: Secondary | ICD-10-CM | POA: Diagnosis not present

## 2021-10-17 DIAGNOSIS — E785 Hyperlipidemia, unspecified: Secondary | ICD-10-CM | POA: Diagnosis not present

## 2021-10-17 NOTE — Patient Instructions (Signed)

## 2021-11-09 ENCOUNTER — Other Ambulatory Visit: Payer: Self-pay | Admitting: Cardiology

## 2021-11-28 ENCOUNTER — Ambulatory Visit (INDEPENDENT_AMBULATORY_CARE_PROVIDER_SITE_OTHER): Payer: Medicare Other | Admitting: Physician Assistant

## 2021-11-28 ENCOUNTER — Encounter: Payer: Self-pay | Admitting: Physician Assistant

## 2021-11-28 VITALS — BP 124/68 | HR 85 | Ht 65.0 in | Wt 158.0 lb

## 2021-11-28 DIAGNOSIS — K219 Gastro-esophageal reflux disease without esophagitis: Secondary | ICD-10-CM

## 2021-11-28 MED ORDER — FAMOTIDINE 20 MG PO TABS: 20.0000 mg | ORAL_TABLET | Freq: Every day | ORAL | 11 refills | Status: DC | PRN

## 2021-11-28 NOTE — Patient Instructions (Signed)
If you are age 77 or older, your body mass index should be between 23-30. Your Body mass index is 26.29 kg/m. If this is out of the aforementioned range listed, please consider follow up with your Primary Care Provider. ________________________________________________________  The [facility] providers would like to encourage you to use [facility] to communicate with providers for non-urgent requests or questions.  Due to long hold times on the telephone, sending your provider a message by [facility] may be a faster and more efficient way to get a response.  Please allow 48 business hours for a response.  Please remember that this is for non-urgent requests.  _______________________________________________________  Refills of Famotidine 20 mg 1 tablet daily as needed, have been sent to your pharmacy.  Follow up as needed.  Thank you for entrusting me with your care and choosing [facility].  Ellouise Newer, PA-C

## 2021-11-28 NOTE — Progress Notes (Signed)
Chief Complaint: Extra gas and GERD  HPI:    Kristen Pena is a 76 year old African-American female with past medical history as listed below known to Dr. Fuller Plan, who was referred to me by Reynold Bowen, MD for a complaint of "extra gas" and GERD.      10/10/2020 colonoscopy for surveillance of adenomatous polyps and a family history of colon cancer with one 6 mm polyp in the transverse colon and internal hemorrhoids.  No repeat recommended due to age.    Today, patient presents to clinic and tells me that back in February she started eating all the things that she knew were not good for her including a lot of chocolate and excessive beans and cucumber.  Tells me she overindulged in all these things and started with some atypical chest pain, this led to her going to the ER where she was told it was likely just reflux in her stomach.  She was started on Famotidine 20 mg daily and her symptoms resolved after she fixed her diet.  She has since been to follow-up with her cardiologist and is doing well.  Tells me that as long as she does not overindulge she seems to be able to control her symptoms.  Currently using Famotidine 20 mg every other day.    Denies fever, chills, weight loss, change in bowel habits or blood in her stool.  Past Medical History:  Diagnosis Date   Allergic rhinitis    Allergy    Arthritis    left knee    Cataract    forming    GERD (gastroesophageal reflux disease)    PAST hx- diet controlled    Hyperlipidemia    Hypertension    Controlled on HCTZ   RBBB    Thyroid disease     Past Surgical History:  Procedure Laterality Date   ABDOMINAL HYSTERECTOMY     COLONOSCOPY     POLYPECTOMY     UPPER GASTROINTESTINAL ENDOSCOPY      Current Outpatient Medications  Medication Sig Dispense Refill   ezetimibe (ZETIA) 10 MG tablet Take 10 mg by mouth daily.     famotidine (PEPCID) 20 MG tablet Take 1 tablet (20 mg total) by mouth 2 (two) times daily. 30 tablet 0    Fexofenadine HCl (ALLEGRA PO) Take 1 tablet by mouth daily.      fluticasone (FLONASE SENSIMIST) 27.5 MCG/SPRAY nasal spray 1-2 sprays each nostril     hydrochlorothiazide (MICROZIDE) 12.5 MG capsule Take 12.5 mg by mouth daily.      montelukast (SINGULAIR) 10 MG tablet Take 10 mg by mouth daily.      rosuvastatin (CRESTOR) 10 MG tablet TAKE ONE TABLET BY MOUTH ONE TIME DAILY AT NIGHT EXCEPT TUESDAY AND FRIDAY 75 tablet 3   Vitamin D, Ergocalciferol, (DRISDOL) 50000 units CAPS capsule Take 1 capsule by mouth every 7 (seven) days.  1   omeprazole (PRILOSEC) 20 MG capsule Take 1 capsule (20 mg total) by mouth daily for 14 days. 14 capsule 0   No current facility-administered medications for this visit.    Allergies as of 11/28/2021   (No Known Allergies)    Family History  Problem Relation Age of Onset   Colon cancer Mother 45   Stomach cancer Father    Dementia Brother    Colon polyps Neg Hx    Esophageal cancer Neg Hx    Rectal cancer Neg Hx     Social History   Socioeconomic History   Marital  status: Single    Spouse name: Not on file   Number of children: 2   Years of education: Not on file   Highest education level: Not on file  Occupational History   Occupation: Restaurant  Tobacco Use   Smoking status: Never   Smokeless tobacco: Never  Vaping Use   Vaping Use: Never used  Substance and Sexual Activity   Alcohol use: Yes    Alcohol/week: 0.0 standard drinks of alcohol    Comment: rare- 1 glass of wine per year   Drug use: No   Sexual activity: Not on file  Other Topics Concern   Not on file  Social History Narrative   Not on file   Social Determinants of Health   Financial Resource Strain: Not on file  Food Insecurity: Not on file  Transportation Needs: Not on file  Physical Activity: Not on file  Stress: Not on file  Social Connections: Not on file  Intimate Partner Violence: Not on file    Review of Systems:    Constitutional: No weight loss, fever  or chills Cardiovascular: No chest pain Respiratory: No SOB Gastrointestinal: See HPI and otherwise negative   Physical Exam:  Vital signs: BP 124/68   Pulse 85   Ht '5\' 5"'$  (1.651 m)   Wt 158 lb (71.7 kg)   SpO2 98%   BMI 26.29 kg/m    Constitutional:   Pleasant AA female appears to be in NAD, Well developed, Well nourished, alert and cooperative Respiratory: Respirations even and unlabored. Lungs clear to auscultation bilaterally.   No wheezes, crackles, or rhonchi.  Cardiovascular: Normal S1, S2. No MRG. Regular rate and rhythm. No peripheral edema, cyanosis or pallor.  Gastrointestinal:  Soft, nondistended, nontender. No rebound or guarding. Normal bowel sounds. No appreciable masses or hepatomegaly. Rectal:  Not performed.  Psychiatric: Oriented to person, place and time. Demonstrates good judgement and reason without abnormal affect or behaviors.  RELEVANT LABS AND IMAGING: CBC    Component Value Date/Time   WBC 6.3 09/23/2021 1059   RBC 5.03 09/23/2021 1059   HGB 15.1 (H) 09/23/2021 1059   HCT 45.6 09/23/2021 1059   PLT 154 09/23/2021 1059   MCV 90.7 09/23/2021 1059   MCH 30.0 09/23/2021 1059   MCHC 33.1 09/23/2021 1059   RDW 13.2 09/23/2021 1059   LYMPHSABS 3.6 09/23/2021 1059   MONOABS 0.5 09/23/2021 1059   EOSABS 0.0 09/23/2021 1059   BASOSABS 0.0 09/23/2021 1059    CMP     Component Value Date/Time   NA 143 09/23/2021 1059   K 3.5 09/23/2021 1059   CL 104 09/23/2021 1059   CO2 31 09/23/2021 1059   GLUCOSE 99 09/23/2021 1059   BUN 22 09/23/2021 1059   CREATININE 0.99 09/23/2021 1059   CALCIUM 10.0 09/23/2021 1059   GFRNONAA 59 (L) 09/23/2021 1059    Assessment: 1.  GERD/gas: Started with increased gas and GERD and atypical chest pain a few months ago when she was over indulging in foods that gave her problems, since then has stopped doing this and is back on her typical diet and taking Famotidine 20 mg every other day and having no further problems;  likely just reactive gastritis  Plan: 1.  Continue Famotidine 20 mg daily or every other day as needed for symptoms.  Prescribed #30 with 11 refills 2.  Reviewed antireflux diet and lifestyle modifications. 3.  Patient to follow in clinic with [place] as needed.  Ellouise Newer, PA-C [facility]  11/28/2021, 9:05 AM  Cc: Reynold Bowen, MD

## 2022-01-08 DIAGNOSIS — L989 Disorder of the skin and subcutaneous tissue, unspecified: Secondary | ICD-10-CM | POA: Diagnosis not present

## 2022-01-08 DIAGNOSIS — D485 Neoplasm of uncertain behavior of skin: Secondary | ICD-10-CM | POA: Diagnosis not present

## 2022-01-29 DIAGNOSIS — E785 Hyperlipidemia, unspecified: Secondary | ICD-10-CM | POA: Diagnosis not present

## 2022-01-29 DIAGNOSIS — R7989 Other specified abnormal findings of blood chemistry: Secondary | ICD-10-CM | POA: Diagnosis not present

## 2022-01-29 DIAGNOSIS — I10 Essential (primary) hypertension: Secondary | ICD-10-CM | POA: Diagnosis not present

## 2022-01-29 DIAGNOSIS — F419 Anxiety disorder, unspecified: Secondary | ICD-10-CM | POA: Diagnosis not present

## 2022-01-29 DIAGNOSIS — E0520 Thyrotoxicosis with toxic multinodular goiter without thyrotoxic crisis or storm: Secondary | ICD-10-CM | POA: Diagnosis not present

## 2022-01-29 DIAGNOSIS — E559 Vitamin D deficiency, unspecified: Secondary | ICD-10-CM | POA: Diagnosis not present

## 2022-02-01 DIAGNOSIS — I700 Atherosclerosis of aorta: Secondary | ICD-10-CM | POA: Diagnosis not present

## 2022-02-01 DIAGNOSIS — E785 Hyperlipidemia, unspecified: Secondary | ICD-10-CM | POA: Diagnosis not present

## 2022-02-01 DIAGNOSIS — F419 Anxiety disorder, unspecified: Secondary | ICD-10-CM | POA: Diagnosis not present

## 2022-02-01 DIAGNOSIS — E559 Vitamin D deficiency, unspecified: Secondary | ICD-10-CM | POA: Diagnosis not present

## 2022-02-01 DIAGNOSIS — M8580 Other specified disorders of bone density and structure, unspecified site: Secondary | ICD-10-CM | POA: Diagnosis not present

## 2022-02-01 DIAGNOSIS — K219 Gastro-esophageal reflux disease without esophagitis: Secondary | ICD-10-CM | POA: Diagnosis not present

## 2022-02-01 DIAGNOSIS — E0520 Thyrotoxicosis with toxic multinodular goiter without thyrotoxic crisis or storm: Secondary | ICD-10-CM | POA: Diagnosis not present

## 2022-02-01 DIAGNOSIS — I6522 Occlusion and stenosis of left carotid artery: Secondary | ICD-10-CM | POA: Diagnosis not present

## 2022-02-01 DIAGNOSIS — R7989 Other specified abnormal findings of blood chemistry: Secondary | ICD-10-CM | POA: Diagnosis not present

## 2022-02-01 DIAGNOSIS — I10 Essential (primary) hypertension: Secondary | ICD-10-CM | POA: Diagnosis not present

## 2022-02-01 DIAGNOSIS — D126 Benign neoplasm of colon, unspecified: Secondary | ICD-10-CM | POA: Diagnosis not present

## 2022-02-01 DIAGNOSIS — I2584 Coronary atherosclerosis due to calcified coronary lesion: Secondary | ICD-10-CM | POA: Diagnosis not present

## 2022-03-15 DIAGNOSIS — Z23 Encounter for immunization: Secondary | ICD-10-CM | POA: Diagnosis not present

## 2022-03-20 ENCOUNTER — Other Ambulatory Visit: Payer: Self-pay | Admitting: Endocrinology

## 2022-03-20 DIAGNOSIS — Z1231 Encounter for screening mammogram for malignant neoplasm of breast: Secondary | ICD-10-CM

## 2022-04-07 DIAGNOSIS — Z23 Encounter for immunization: Secondary | ICD-10-CM | POA: Diagnosis not present

## 2022-04-30 ENCOUNTER — Ambulatory Visit
Admission: RE | Admit: 2022-04-30 | Discharge: 2022-04-30 | Disposition: A | Discharge status: Home / Self Care | Payer: Medicare Other | Source: Ambulatory Visit | Attending: Endocrinology | Admitting: Endocrinology

## 2022-04-30 DIAGNOSIS — Z1231 Encounter for screening mammogram for malignant neoplasm of breast: Secondary | ICD-10-CM

## 2022-05-16 DIAGNOSIS — M545 Low back pain, unspecified: Secondary | ICD-10-CM | POA: Diagnosis not present

## 2022-05-24 DIAGNOSIS — N3941 Urge incontinence: Secondary | ICD-10-CM | POA: Diagnosis not present

## 2022-05-24 DIAGNOSIS — Z01411 Encounter for gynecological examination (general) (routine) with abnormal findings: Secondary | ICD-10-CM | POA: Diagnosis not present

## 2022-05-31 DIAGNOSIS — M5451 Vertebrogenic low back pain: Secondary | ICD-10-CM | POA: Diagnosis not present

## 2022-06-06 DIAGNOSIS — M5451 Vertebrogenic low back pain: Secondary | ICD-10-CM | POA: Diagnosis not present

## 2022-06-08 DIAGNOSIS — L821 Other seborrheic keratosis: Secondary | ICD-10-CM | POA: Diagnosis not present

## 2022-06-08 DIAGNOSIS — L814 Other melanin hyperpigmentation: Secondary | ICD-10-CM | POA: Diagnosis not present

## 2022-06-08 DIAGNOSIS — D225 Melanocytic nevi of trunk: Secondary | ICD-10-CM | POA: Diagnosis not present

## 2022-06-13 DIAGNOSIS — M5451 Vertebrogenic low back pain: Secondary | ICD-10-CM | POA: Diagnosis not present

## 2022-06-20 DIAGNOSIS — M5451 Vertebrogenic low back pain: Secondary | ICD-10-CM | POA: Diagnosis not present

## 2022-06-28 DIAGNOSIS — M5451 Vertebrogenic low back pain: Secondary | ICD-10-CM | POA: Diagnosis not present

## 2022-07-03 ENCOUNTER — Encounter: Payer: Self-pay | Admitting: Podiatry

## 2022-07-03 ENCOUNTER — Ambulatory Visit (INDEPENDENT_AMBULATORY_CARE_PROVIDER_SITE_OTHER): Payer: Medicare Other | Admitting: Podiatry

## 2022-07-03 DIAGNOSIS — L600 Ingrowing nail: Secondary | ICD-10-CM | POA: Diagnosis not present

## 2022-07-03 NOTE — Progress Notes (Signed)
  Subjective:  Patient ID: Kristen Pena, female    DOB: 10-01-1945,   MRN: [id number]  Chief Complaint  Patient presents with   Ingrown Toenail    Patient is here for ingrown toe nail on right foot 2nd and third toe, and left 2nd toe.    77 y.o. female presents for concern of bilateral third toe pain. Relates it has been bothering her for a while. States she goes to the salon and has her nails trimmed but concerned these nails may be ingrown. Denies any treatments. . Denies any other pedal complaints. Denies n/v/f/c.   Past Medical History:  Diagnosis Date   Allergic rhinitis    Allergy    Arthritis    left knee    Cataract    forming    GERD (gastroesophageal reflux disease)    PAST hx- diet controlled    Hyperlipidemia    Hypertension    Controlled on HCTZ   RBBB    Thyroid disease     Objective:  Physical Exam: Vascular: DP/PT pulses 2/4 bilateral. CFT <3 seconds. Normal hair growth on digits. No edema.  Skin. No lacerations or abrasions bilateral feet. Incurvation of lateral border of bilateral third toes and right second toe. No erythema edema or purulence noted.  Musculoskeletal: MMT 5/5 bilateral lower extremities in DF, PF, Inversion and Eversion. Deceased ROM in DF of ankle joint.  Neurological: Sensation intact to light touch.   Assessment:   1. Ingrown nail      Plan:  Patient was evaluated and treated and all questions answered. -Discussed and educated patient on  foot care, especially with  regards to the vascular, neurological and musculoskeletal systems.  -Discussed supportive shoes at all times and checking feet regularly.  -Mechanically debrided all nails 1-5 bilateral using sterile nail nipper and filed with dremel without incident as courtesy -Discussed if they continue to cause problems can try nail procedure to remove ingrown aspects.  -Answered all patient questions -Patient to return  as needed   Lorenda Peck, DPM

## 2022-07-05 DIAGNOSIS — M5451 Vertebrogenic low back pain: Secondary | ICD-10-CM | POA: Diagnosis not present

## 2022-07-23 DIAGNOSIS — E0520 Thyrotoxicosis with toxic multinodular goiter without thyrotoxic crisis or storm: Secondary | ICD-10-CM | POA: Diagnosis not present

## 2022-07-23 DIAGNOSIS — E785 Hyperlipidemia, unspecified: Secondary | ICD-10-CM | POA: Diagnosis not present

## 2022-07-23 DIAGNOSIS — I700 Atherosclerosis of aorta: Secondary | ICD-10-CM | POA: Diagnosis not present

## 2022-07-23 DIAGNOSIS — I10 Essential (primary) hypertension: Secondary | ICD-10-CM | POA: Diagnosis not present

## 2022-07-23 DIAGNOSIS — E559 Vitamin D deficiency, unspecified: Secondary | ICD-10-CM | POA: Diagnosis not present

## 2022-07-27 DIAGNOSIS — I2584 Coronary atherosclerosis due to calcified coronary lesion: Secondary | ICD-10-CM | POA: Diagnosis not present

## 2022-07-27 DIAGNOSIS — E0520 Thyrotoxicosis with toxic multinodular goiter without thyrotoxic crisis or storm: Secondary | ICD-10-CM | POA: Diagnosis not present

## 2022-07-27 DIAGNOSIS — E559 Vitamin D deficiency, unspecified: Secondary | ICD-10-CM | POA: Diagnosis not present

## 2022-07-27 DIAGNOSIS — E785 Hyperlipidemia, unspecified: Secondary | ICD-10-CM | POA: Diagnosis not present

## 2022-07-27 DIAGNOSIS — I6522 Occlusion and stenosis of left carotid artery: Secondary | ICD-10-CM | POA: Diagnosis not present

## 2022-07-27 DIAGNOSIS — I700 Atherosclerosis of aorta: Secondary | ICD-10-CM | POA: Diagnosis not present

## 2022-07-27 DIAGNOSIS — M8580 Other specified disorders of bone density and structure, unspecified site: Secondary | ICD-10-CM | POA: Diagnosis not present

## 2022-07-27 DIAGNOSIS — I10 Essential (primary) hypertension: Secondary | ICD-10-CM | POA: Diagnosis not present

## 2022-07-27 DIAGNOSIS — D126 Benign neoplasm of colon, unspecified: Secondary | ICD-10-CM | POA: Diagnosis not present

## 2022-07-27 DIAGNOSIS — K219 Gastro-esophageal reflux disease without esophagitis: Secondary | ICD-10-CM | POA: Diagnosis not present

## 2022-07-27 DIAGNOSIS — F419 Anxiety disorder, unspecified: Secondary | ICD-10-CM | POA: Diagnosis not present

## 2022-09-27 DIAGNOSIS — J3089 Other allergic rhinitis: Secondary | ICD-10-CM | POA: Diagnosis not present

## 2022-09-27 DIAGNOSIS — J3081 Allergic rhinitis due to animal (cat) (dog) hair and dander: Secondary | ICD-10-CM | POA: Diagnosis not present

## 2022-09-27 DIAGNOSIS — H1045 Other chronic allergic conjunctivitis: Secondary | ICD-10-CM | POA: Diagnosis not present

## 2022-09-27 DIAGNOSIS — J301 Allergic rhinitis due to pollen: Secondary | ICD-10-CM | POA: Diagnosis not present

## 2022-10-02 ENCOUNTER — Encounter: Payer: Self-pay | Admitting: Podiatry

## 2022-10-02 ENCOUNTER — Ambulatory Visit (INDEPENDENT_AMBULATORY_CARE_PROVIDER_SITE_OTHER): Payer: Medicare Other | Admitting: Podiatry

## 2022-10-02 DIAGNOSIS — L84 Corns and callosities: Secondary | ICD-10-CM | POA: Diagnosis not present

## 2022-10-02 DIAGNOSIS — M2041 Other hammer toe(s) (acquired), right foot: Secondary | ICD-10-CM | POA: Diagnosis not present

## 2022-10-02 NOTE — Progress Notes (Signed)
  Subjective:  Patient ID: Kristen Pena, female    DOB: 1945/12/15,   MRN: [id number]  Chief Complaint  Patient presents with   Callouses     Right foot corn 4th digit    77 y.o. female presents for concern of corn on the top of the right fourth digit and a hammertoe. Relates she was doing well with this toe but then wore some cute shoes and ended up with an area on the top of the fourth toe. Hoping to get it feeling better. Denies any treatments. . Denies any other pedal complaints. Denies n/v/f/c.   Past Medical History:  Diagnosis Date   Allergic rhinitis    Allergy    Arthritis    left knee    Cataract    forming    GERD (gastroesophageal reflux disease)    PAST hx- diet controlled    Hyperlipidemia    Hypertension    Controlled on HCTZ   RBBB    Thyroid disease     Objective:  Physical Exam: Vascular: DP/PT pulses 2/4 bilateral. CFT <3 seconds. Normal hair growth on digits. No edema.  Skin. No lacerations or abrasions bilateral feet.Toes are looking better with no signs of incurvation. Right fourth digit dorsal hyperkeratosis noted.   Musculoskeletal: MMT 5/5 bilateral lower extremities in DF, PF, Inversion and Eversion. Deceased ROM in DF of ankle joint.  Neurological: Sensation intact to light touch.   Assessment:   1. Corns and callosities   2. Hammer toe of right foot       Plan:  Patient was evaluated and treated and all questions answered. -Discussed and educated patient on  foot care, especially with  regards to the vascular, neurological and musculoskeletal systems.  -Discussed supportive shoes at all times and checking feet regularly.  -Mechanically debrided hyperkeratosis wit chisel without incident as courtesy.  -Discussed avoiding certain shoes.  If needed can consider surgical procedure in future but hopefully can avoid.  -Answered all patient questions -Patient to return  as needed   Louann Sjogren, DPM

## 2022-10-17 DIAGNOSIS — H524 Presbyopia: Secondary | ICD-10-CM | POA: Diagnosis not present

## 2022-10-17 DIAGNOSIS — H2513 Age-related nuclear cataract, bilateral: Secondary | ICD-10-CM | POA: Diagnosis not present

## 2022-10-28 DIAGNOSIS — I444 Left anterior fascicular block: Secondary | ICD-10-CM | POA: Insufficient documentation

## 2022-10-28 NOTE — Progress Notes (Unsigned)
  Cardiology Office Note:   Date:  10/31/2022  ID:  Kristen Pena, Kristen Pena 01/09/1946, MRN [id number]  History of Present Illness:   Kristen Pena is a 77 y.o. female who presents for evaluaiton of an abnormal EKG . She had a right bundle branch block. There was an episode of syncope at that time but a monitor was unremarkable. Echocardiogram was unremarkable. It was thought to be vasovagal.  Of note her previous echo did suggest some impaired relaxation and questionable prolapsing mitral valve without regurgitation.  However, MR was not evident when I repeated this last year. She had only mild LVH.    A calcium score in May of last year was minimally elevated.   She has been doing well.  The patient denies any new symptoms such as chest discomfort, neck or arm discomfort. There has been no new shortness of breath, PND or orthopnea. There have been no reported palpitations, presyncope or syncope.  She does walk for exercise.    ROS: As stated in the HPI and negative for all other systems.  Studies Reviewed:    EKG: Sinus rhythm, rate 66 right bundle branch block, left anterior fascicular block, first-degree AV block, no change from previous    Risk Assessment/Calculations:          Physical Exam:   VS:  BP (!) 162/84   Pulse 62   Ht 5\' 6"  (1.676 m)   Wt 159 lb 2 oz (72.2 kg)   BMI 25.68 kg/m    Wt Readings from Last 3 Encounters:  10/31/22 159 lb 2 oz (72.2 kg)  11/28/21 158 lb (71.7 kg)  10/17/21 161 lb (73 kg)     GEN: Well nourished, well developed in no acute distress NECK: No JVD; No carotid bruits CARDIAC: RRR, no murmurs, rubs, gallops RESPIRATORY:  Clear to auscultation without rales, wheezing or rhonchi  ABDOMEN: Soft, non-tender, non-distended EXTREMITIES:  No edema; No deformity   ASSESSMENT AND PLAN:   CHEST PAIN: This was a GI issue thinks related to caffeine.  No change in therapy.  She is no longer having this.   HTN: Her blood pressure is elevated but she  says this is very unusual.  It is in the 120s at home.  We talked her about how she can get her,  I am sure is accurate and keep a blood pressure diary.  She likely will need titration.   DYSLIPIDEMIA: The LDL was 70 weeks.  Hyperlipidemia in the 50s.  I am going to have her take her Crestor every day.   BIFASICULAR BLOCK:   She still has had no symptoms related to this.  No change in therapy. Pain.  About this again.      Signed, Rollene Rotunda, MD

## 2022-10-31 ENCOUNTER — Ambulatory Visit: Payer: Medicare Other | Attending: Cardiology | Admitting: Cardiology

## 2022-10-31 ENCOUNTER — Encounter: Payer: Self-pay | Admitting: Cardiology

## 2022-10-31 VITALS — BP 162/84 | HR 62 | Ht 66.0 in | Wt 159.1 lb

## 2022-10-31 DIAGNOSIS — I10 Essential (primary) hypertension: Secondary | ICD-10-CM | POA: Diagnosis not present

## 2022-10-31 DIAGNOSIS — I444 Left anterior fascicular block: Secondary | ICD-10-CM | POA: Diagnosis not present

## 2022-10-31 DIAGNOSIS — E785 Hyperlipidemia, unspecified: Secondary | ICD-10-CM | POA: Diagnosis not present

## 2022-10-31 DIAGNOSIS — R072 Precordial pain: Secondary | ICD-10-CM | POA: Diagnosis not present

## 2022-10-31 NOTE — Progress Notes (Signed)
See below

## 2022-10-31 NOTE — Patient Instructions (Addendum)
Medication Instructions:  Increase Crestor to 10 mg daily Continue all other medications *If you need a refill on your cardiac medications before your next appointment, please call your pharmacy*   Lab Work: None ordered   Testing/Procedures: None ordered   Follow-Up: At [facility], you and your health needs are our priority.  As part of our continuing mission to provide you with exceptional heart care, we have created designated Provider Care Teams.  These Care Teams include your primary Cardiologist (physician) and Advanced Practice Providers (APPs -  Physician Assistants and Nurse Practitioners) who all work together to provide you with the care you need, when you need it.  We recommend signing up for the patient portal called "MyChart".  Sign up information is provided on this After Visit Summary.  MyChart is used to connect with patients for Virtual Visits (Telemedicine).  Patients are able to view lab/test results, encounter notes, upcoming appointments, etc.  Non-urgent messages can be sent to your provider as well.   To learn more about what you can do with MyChart, go to [email or web address].    Your next appointment:  1 year    Call in Feb to schedule May appointment     Provider:  Dr.Hochrein   Monitor blood pressure daily and keep a log

## 2022-11-07 DIAGNOSIS — J301 Allergic rhinitis due to pollen: Secondary | ICD-10-CM | POA: Diagnosis not present

## 2022-11-07 DIAGNOSIS — H1045 Other chronic allergic conjunctivitis: Secondary | ICD-10-CM | POA: Diagnosis not present

## 2022-11-07 DIAGNOSIS — J3081 Allergic rhinitis due to animal (cat) (dog) hair and dander: Secondary | ICD-10-CM | POA: Diagnosis not present

## 2022-11-07 DIAGNOSIS — M7542 Impingement syndrome of left shoulder: Secondary | ICD-10-CM | POA: Diagnosis not present

## 2022-11-07 DIAGNOSIS — J3089 Other allergic rhinitis: Secondary | ICD-10-CM | POA: Diagnosis not present

## 2022-11-12 ENCOUNTER — Encounter: Payer: Self-pay | Admitting: Cardiology

## 2022-11-12 DIAGNOSIS — I5032 Chronic diastolic (congestive) heart failure: Secondary | ICD-10-CM

## 2022-11-12 DIAGNOSIS — I10 Essential (primary) hypertension: Secondary | ICD-10-CM

## 2022-11-14 MED ORDER — HYDROCHLOROTHIAZIDE 12.5 MG PO CAPS: 25.0000 mg | ORAL_CAPSULE | Freq: Every day | ORAL | 1 refills | Status: DC

## 2022-11-29 ENCOUNTER — Telehealth: Payer: Self-pay | Admitting: Cardiology

## 2022-11-29 ENCOUNTER — Other Ambulatory Visit: Payer: Self-pay

## 2022-11-29 DIAGNOSIS — I5032 Chronic diastolic (congestive) heart failure: Secondary | ICD-10-CM

## 2022-11-29 DIAGNOSIS — I10 Essential (primary) hypertension: Secondary | ICD-10-CM

## 2022-11-29 NOTE — Telephone Encounter (Signed)
Paper Work Dropped Off: BP tracker sheet  Date: 06.13.24  Location of paper: Dr [employer]

## 2022-11-29 NOTE — Telephone Encounter (Signed)
Left message for pt, dr hochrein will be back in the office next week and will review then.

## 2022-11-30 ENCOUNTER — Other Ambulatory Visit: Payer: Self-pay | Admitting: Physician Assistant

## 2022-11-30 ENCOUNTER — Telehealth: Payer: Self-pay | Admitting: *Deleted

## 2022-11-30 DIAGNOSIS — E876 Hypokalemia: Secondary | ICD-10-CM

## 2022-11-30 LAB — BASIC METABOLIC PANEL
BUN/Creatinine Ratio: 21 (ref 12–28)
BUN: 21 mg/dL (ref 8–27)
CO2: 27 mmol/L (ref 20–29)
Calcium: 9.8 mg/dL (ref 8.7–10.3)
Chloride: 100 mmol/L (ref 96–106)
Creatinine, Ser: 1 mg/dL (ref 0.57–1.00)
Glucose: 110 mg/dL — ABNORMAL HIGH (ref 70–99)
Potassium: 3.4 mmol/L — ABNORMAL LOW (ref 3.5–5.2)
Sodium: 142 mmol/L (ref 134–144)
eGFR: 58 mL/min/{1.73_m2} — ABNORMAL LOW (ref >59)

## 2022-11-30 MED ORDER — POTASSIUM CHLORIDE ER 10 MEQ PO TBCR: 10.0000 meq | EXTENDED_RELEASE_TABLET | Freq: Every day | ORAL | 3 refills | Status: DC

## 2022-11-30 NOTE — Telephone Encounter (Signed)
-----   Message from Rollene Rotunda, MD sent at 11/30/2022  3:39 PM EDT ----- Potassium is slightly low.  Please take 10 meq of potassium daily with this BP med and repeat a BMET in 10 days.   Call Ms. Carmicheal with the results and send results to Adrian Prince, MD

## 2022-11-30 NOTE — Telephone Encounter (Signed)
Spoke with pt, aware of results and recommendations. °New script sent to the pharmacy  °Lab orders mailed to the pt  °

## 2022-12-05 ENCOUNTER — Encounter: Payer: Self-pay | Admitting: *Deleted

## 2022-12-10 ENCOUNTER — Telehealth: Payer: Self-pay | Admitting: Cardiology

## 2022-12-10 NOTE — Telephone Encounter (Signed)
Paper Work Dropped Off: Blood Pressure Log   Date: 06.24.24 @11  am  Location of paper: Dr [employer]

## 2022-12-11 LAB — BASIC METABOLIC PANEL
BUN/Creatinine Ratio: 15 (ref 12–28)
BUN: 15 mg/dL (ref 8–27)
CO2: 28 mmol/L (ref 20–29)
Calcium: 9.5 mg/dL (ref 8.7–10.3)
Chloride: 102 mmol/L (ref 96–106)
Creatinine, Ser: 0.99 mg/dL (ref 0.57–1.00)
Glucose: 94 mg/dL (ref 70–99)
Potassium: 3.6 mmol/L (ref 3.5–5.2)
Sodium: 142 mmol/L (ref 134–144)
eGFR: 59 mL/min/{1.73_m2} — ABNORMAL LOW (ref >59)

## 2022-12-12 ENCOUNTER — Encounter: Payer: Self-pay | Admitting: *Deleted

## 2022-12-14 NOTE — Telephone Encounter (Signed)
Patient aware no changes per dr hochrein.

## 2022-12-14 NOTE — Telephone Encounter (Signed)
Spoke with pt, aware blood pressures reviewed by dr hochrein and he is not making any changes based on those results.

## 2023-01-11 ENCOUNTER — Other Ambulatory Visit: Payer: Self-pay | Admitting: Cardiology

## 2023-02-04 ENCOUNTER — Encounter (HOSPITAL_BASED_OUTPATIENT_CLINIC_OR_DEPARTMENT_OTHER): Payer: Self-pay | Admitting: Emergency Medicine

## 2023-02-04 ENCOUNTER — Emergency Department (HOSPITAL_BASED_OUTPATIENT_CLINIC_OR_DEPARTMENT_OTHER)
Admission: EM | Admit: 2023-02-04 | Discharge: 2023-02-04 | Disposition: A | Discharge status: Home / Self Care | Payer: Medicare Other | Source: Home / Self Care | Attending: Emergency Medicine | Admitting: Emergency Medicine

## 2023-02-04 ENCOUNTER — Emergency Department (HOSPITAL_BASED_OUTPATIENT_CLINIC_OR_DEPARTMENT_OTHER): Payer: Medicare Other

## 2023-02-04 DIAGNOSIS — I10 Essential (primary) hypertension: Secondary | ICD-10-CM | POA: Insufficient documentation

## 2023-02-04 DIAGNOSIS — S0993XA Unspecified injury of face, initial encounter: Secondary | ICD-10-CM | POA: Diagnosis present

## 2023-02-04 DIAGNOSIS — S0181XA Laceration without foreign body of other part of head, initial encounter: Secondary | ICD-10-CM | POA: Diagnosis not present

## 2023-02-04 DIAGNOSIS — W19XXXA Unspecified fall, initial encounter: Secondary | ICD-10-CM

## 2023-02-04 DIAGNOSIS — W01198A Fall on same level from slipping, tripping and stumbling with subsequent striking against other object, initial encounter: Secondary | ICD-10-CM | POA: Diagnosis not present

## 2023-02-04 DIAGNOSIS — Z79899 Other long term (current) drug therapy: Secondary | ICD-10-CM | POA: Diagnosis not present

## 2023-02-04 NOTE — ED Triage Notes (Signed)
Trip fall on Saturday. Fall down 2-3 steps onto left side. Small lac near left eye, sore when touched  Hit head, no LOC no blood thinners Feels ok, was seen by eye today who advised CT

## 2023-02-04 NOTE — Discharge Instructions (Signed)
As we discussed, your workup in the ER today was reassuring for acute findings.  CT imaging of your head, neck, and face did not reveal any emergent concerns or abnormalities.  I recommend taking Tylenol/ibuprofen as needed for pain and following up closely with your primary doctor.  Return if development of any new or worsening symptoms.

## 2023-02-04 NOTE — ED Notes (Signed)
Patient verbalizes understanding of discharge instructions. Opportunity for questioning and answers were provided. Patient discharged from ED. vss

## 2023-02-04 NOTE — ED Provider Notes (Signed)
[place] EMERGENCY DEPARTMENT AT [facility] Provider Note   CSN: [id number] Arrival date & time: 02/04/23  1657     History  Chief Complaint  Patient presents with   Kristen Pena is a 77 y.o. female.  Patient with history of hypertension and hyperlipidemia presents today with complaints of fall.  She states that same occurred on Saturday evening (2 days ago) when she was walking down the steps to take her dog out. She states that the dog pulled her down the steps with the leash and she fell and struck the left side of her face on the concrete. She did not lose consciousness.  She is not anticoagulated.  She was able to get up off the ground and walk around afterwards.  She has had a little bit of soreness on the left side of her face without any vision changes.  She notes that her glasses broke when she fell and she went to her ophthalmologist today to get a new prescription and her ophthalmologist recommended that she come here for a CT of her head given her age and head trauma.  She presents for same.  Denies any other injuries or complaints.  The history is provided by the patient. No language interpreter was used.  Fall       Home Medications Prior to Admission medications   Medication Sig Start Date End Date Taking? Authorizing Provider  ezetimibe (ZETIA) 10 MG tablet Take 10 mg by mouth daily.    [provider]  famotidine (PEPCID) 20 MG tablet TAKE 1 TABLET BY MOUTH DAILY AS NEEDED FOR HEARTBURN OR INDIGESTION 11/30/22   Unk Lightning, PA  Fexofenadine HCl (ALLEGRA PO) Take 1 tablet by mouth daily.     [provider]  fluticasone (FLONASE SENSIMIST) 27.5 MCG/SPRAY nasal spray 1-2 sprays each nostril    [provider]  hydrochlorothiazide (MICROZIDE) 12.5 MG capsule Take 2 capsules (25 mg total) by mouth daily. 11/14/22   Rollene Rotunda, MD  montelukast (SINGULAIR) 10 MG tablet Take 10 mg by mouth daily.  03/04/15    [provider]  omeprazole (PRILOSEC) 20 MG capsule Take 1 capsule (20 mg total) by mouth daily for 14 days. 09/23/21 10/17/21  Edwin Dada P, DO  potassium chloride (KLOR-CON) 10 MEQ tablet Take 1 tablet (10 mEq total) by mouth daily. 11/30/22 02/28/23  Rollene Rotunda, MD  rosuvastatin (CRESTOR) 10 MG tablet TAKE ONE TABLET BY MOUTH ONE TIME DAILY AT BEDTIME EXCEPT TUESDAY AND FRIDAY 01/14/23   Rollene Rotunda, MD  Vitamin D, Ergocalciferol, (DRISDOL) 50000 units CAPS capsule Take 1 capsule by mouth every 7 (seven) days. 06/29/16   [provider]      Allergies    Patient has no known allergies.    Review of Systems   Review of Systems  All other systems reviewed and are negative.   Physical Exam Updated Vital Signs BP (!) 139/92 (BP Location: Right Arm)   Pulse 77   Temp 98.8 F (37.1 C)   Resp 18   SpO2 100%  Physical Exam Vitals and nursing note reviewed.  Constitutional:      General: She is not in acute distress.    Appearance: Normal appearance. She is normal weight. She is not ill-appearing, toxic-appearing or diaphoretic.  HENT:     Head: Normocephalic and atraumatic.     Comments: Small well healing well adhered linear laceration noted to the left side of the face.  No swelling, bruising, deformity, or crepitus.  No Battle sign or raccoon eyes. Eyes:     Extraocular Movements: Extraocular movements intact.     Pupils: Pupils are equal, round, and reactive to light.  Cardiovascular:     Rate and Rhythm: Normal rate.  Pulmonary:     Effort: Pulmonary effort is normal. No respiratory distress.  Musculoskeletal:        General: Normal range of motion.     Cervical back: Normal and normal range of motion.     Thoracic back: Normal.     Lumbar back: Normal.     Comments: No midline tenderness, no stepoffs or deformity noted on palpation of cervical, thoracic, and lumbar spine  Patient observed to be ambulatory with steady gait.  Skin:    General: Skin  is warm and dry.  Neurological:     General: No focal deficit present.     Mental Status: She is alert and oriented to person, place, and time.  Psychiatric:        Mood and Affect: Mood normal.        Behavior: Behavior normal.     ED Results / Procedures / Treatments   Labs (all labs ordered are listed, but only abnormal results are displayed) Labs Reviewed - No data to display  EKG None  Radiology CT Head Wo Contrast  Result Date: 02/04/2023 CLINICAL DATA:  Head trauma, minor (Age >= 65y); Facial trauma, blunt; Neck trauma (Age >= 65y) EXAM: CT HEAD WITHOUT CONTRAST CT MAXILLOFACIAL WITHOUT CONTRAST CT CERVICAL SPINE WITHOUT CONTRAST TECHNIQUE: Multidetector CT imaging of the head, cervical spine, and maxillofacial structures were performed using the standard protocol without intravenous contrast. Multiplanar CT image reconstructions of the cervical spine and maxillofacial structures were also generated. RADIATION DOSE REDUCTION: This exam was performed according to the departmental dose-optimization program which includes automated exposure control, adjustment of the mA and/or kV according to patient size and/or use of iterative reconstruction technique. COMPARISON:  None Available. FINDINGS: CT HEAD FINDINGS Brain: Patchy and confluent areas of decreased attenuation are noted throughout the deep and periventricular white matter of the cerebral hemispheres bilaterally, compatible with chronic microvascular ischemic disease. No evidence of large-territorial acute infarction. No parenchymal hemorrhage. No mass lesion. No extra-axial collection. No mass effect or midline shift. No hydrocephalus. Basilar cisterns are patent. Vascular: No hyperdense vessel. Skull: No acute fracture or focal lesion. Other: None. CT MAXILLOFACIAL FINDINGS Osseous: Bilobulated sclerotic osseous exostosis arising the midline hard palate. No fracture or mandibular dislocation. No destructive process. Sinuses/Orbits:  Complete opacification of the right sphenoid sinus with associated C noise sinus wall hypertrophy. Bilateral maxillary polypoid mucosal thickening. Otherwise paranasal sinuses and mastoid air cells are clear. The orbits are unremarkable. Soft tissues: Negative. CT CERVICAL SPINE FINDINGS Alignment: Normal. Skull base and vertebrae: Multilevel mild degenerative changes of the spine. No acute fracture. No aggressive appearing focal osseous lesion or focal pathologic process. Soft tissues and spinal canal: No prevertebral fluid or swelling. No visible canal hematoma. Upper chest: Unremarkable. Other: None. IMPRESSION: 1. No acute intracranial abnormality. 2.  No acute displaced facial fracture. 3. No acute displaced fracture or traumatic listhesis of the cervical spine. 4. Incidentally noted torus palatinus. Electronically Signed   By: Tish Frederickson M.D.   On: 02/04/2023 19:41   CT Maxillofacial Wo Contrast  Result Date: 02/04/2023 CLINICAL DATA:  Head trauma, minor (Age >= 65y); Facial trauma, blunt; Neck trauma (Age >= 65y) EXAM: CT HEAD WITHOUT CONTRAST CT MAXILLOFACIAL WITHOUT  CONTRAST CT CERVICAL SPINE WITHOUT CONTRAST TECHNIQUE: Multidetector CT imaging of the head, cervical spine, and maxillofacial structures were performed using the standard protocol without intravenous contrast. Multiplanar CT image reconstructions of the cervical spine and maxillofacial structures were also generated. RADIATION DOSE REDUCTION: This exam was performed according to the departmental dose-optimization program which includes automated exposure control, adjustment of the mA and/or kV according to patient size and/or use of iterative reconstruction technique. COMPARISON:  None Available. FINDINGS: CT HEAD FINDINGS Brain: Patchy and confluent areas of decreased attenuation are noted throughout the deep and periventricular white matter of the cerebral hemispheres bilaterally, compatible with chronic microvascular ischemic  disease. No evidence of large-territorial acute infarction. No parenchymal hemorrhage. No mass lesion. No extra-axial collection. No mass effect or midline shift. No hydrocephalus. Basilar cisterns are patent. Vascular: No hyperdense vessel. Skull: No acute fracture or focal lesion. Other: None. CT MAXILLOFACIAL FINDINGS Osseous: Bilobulated sclerotic osseous exostosis arising the midline hard palate. No fracture or mandibular dislocation. No destructive process. Sinuses/Orbits: Complete opacification of the right sphenoid sinus with associated C noise sinus wall hypertrophy. Bilateral maxillary polypoid mucosal thickening. Otherwise paranasal sinuses and mastoid air cells are clear. The orbits are unremarkable. Soft tissues: Negative. CT CERVICAL SPINE FINDINGS Alignment: Normal. Skull base and vertebrae: Multilevel mild degenerative changes of the spine. No acute fracture. No aggressive appearing focal osseous lesion or focal pathologic process. Soft tissues and spinal canal: No prevertebral fluid or swelling. No visible canal hematoma. Upper chest: Unremarkable. Other: None. IMPRESSION: 1. No acute intracranial abnormality. 2.  No acute displaced facial fracture. 3. No acute displaced fracture or traumatic listhesis of the cervical spine. 4. Incidentally noted torus palatinus. Electronically Signed   By: Tish Frederickson M.D.   On: 02/04/2023 19:41   CT Cervical Spine Wo Contrast  Result Date: 02/04/2023 CLINICAL DATA:  Head trauma, minor (Age >= 65y); Facial trauma, blunt; Neck trauma (Age >= 65y) EXAM: CT HEAD WITHOUT CONTRAST CT MAXILLOFACIAL WITHOUT CONTRAST CT CERVICAL SPINE WITHOUT CONTRAST TECHNIQUE: Multidetector CT imaging of the head, cervical spine, and maxillofacial structures were performed using the standard protocol without intravenous contrast. Multiplanar CT image reconstructions of the cervical spine and maxillofacial structures were also generated. RADIATION DOSE REDUCTION: This exam was  performed according to the departmental dose-optimization program which includes automated exposure control, adjustment of the mA and/or kV according to patient size and/or use of iterative reconstruction technique. COMPARISON:  None Available. FINDINGS: CT HEAD FINDINGS Brain: Patchy and confluent areas of decreased attenuation are noted throughout the deep and periventricular white matter of the cerebral hemispheres bilaterally, compatible with chronic microvascular ischemic disease. No evidence of large-territorial acute infarction. No parenchymal hemorrhage. No mass lesion. No extra-axial collection. No mass effect or midline shift. No hydrocephalus. Basilar cisterns are patent. Vascular: No hyperdense vessel. Skull: No acute fracture or focal lesion. Other: None. CT MAXILLOFACIAL FINDINGS Osseous: Bilobulated sclerotic osseous exostosis arising the midline hard palate. No fracture or mandibular dislocation. No destructive process. Sinuses/Orbits: Complete opacification of the right sphenoid sinus with associated C noise sinus wall hypertrophy. Bilateral maxillary polypoid mucosal thickening. Otherwise paranasal sinuses and mastoid air cells are clear. The orbits are unremarkable. Soft tissues: Negative. CT CERVICAL SPINE FINDINGS Alignment: Normal. Skull base and vertebrae: Multilevel mild degenerative changes of the spine. No acute fracture. No aggressive appearing focal osseous lesion or focal pathologic process. Soft tissues and spinal canal: No prevertebral fluid or swelling. No visible canal hematoma. Upper chest: Unremarkable. Other: None. IMPRESSION: 1. No acute  intracranial abnormality. 2.  No acute displaced facial fracture. 3. No acute displaced fracture or traumatic listhesis of the cervical spine. 4. Incidentally noted torus palatinus. Electronically Signed   By: Tish Frederickson M.D.   On: 02/04/2023 19:41    Procedures Procedures    Medications Ordered in ED Medications - No data to  display  ED Course/ Medical Decision Making/ A&P                                 Medical Decision Making Amount and/or Complexity of Data Reviewed Radiology: ordered.   Patient presents today with complaints of fall with head injury x 2 days ago.  She is afebrile, nontoxic-appearing, and in no acute distress with reassuring vital signs.  She is also alert and oriented and neurologically intact without focal deficits.  Physical exam benign, small well-healed well healing linear laceration noted to the left side of the face without, bruising, crepitus, or deformity.   Patient without signs of serious head, neck, or back injury. No midline spinal tenderness or TTP of the chest or abd.  Normal neurological exam. No concern for closed head injury, lung injury, or intraabdominal injury.  Given patients age, CT imaging obtained of the head, neck, and max/face which has resulted and reveals  1. No acute intracranial abnormality. 2.  No acute displaced facial fracture. 3. No acute displaced fracture or traumatic listhesis of the cervical spine. 4. Incidentally noted torus palatinus.  I have personally reviewed and interpreted this imaging and agree with radiology interpretation.  Patient is able to ambulate without difficulty in the ED.  Pt is hemodynamically stable, in NAD.  Patient is asymptomatic and has no complaints prior to dc.  Patient counseled on typical course of muscle stiffness and soreness post-fall. Discussed s/s that should cause them to return. Patient instructed on NSAID and tylenol use. Evaluation and diagnostic testing in the emergency department does not suggest an emergent condition requiring admission or immediate intervention beyond what has been performed at this time.  Plan for discharge with close PCP follow-up.  Patient is understanding and amenable with plan, educated on red flag symptoms that would prompt immediate return.  Patient discharged in stable condition.  Final  Clinical Impression(s) / ED Diagnoses Final diagnoses:  Fall, initial encounter    Rx / DC Orders ED Discharge Orders     None     An After Visit Summary was printed and given to the patient.     Vear Clock 02/04/23 2024    Ernie Avena, MD 02/05/23 [phone]

## 2023-03-18 ENCOUNTER — Other Ambulatory Visit: Payer: Self-pay | Admitting: Endocrinology

## 2023-03-18 DIAGNOSIS — Z1231 Encounter for screening mammogram for malignant neoplasm of breast: Secondary | ICD-10-CM

## 2023-05-02 ENCOUNTER — Ambulatory Visit: Payer: Medicare Other

## 2023-05-03 ENCOUNTER — Ambulatory Visit
Admission: RE | Admit: 2023-05-03 | Discharge: 2023-05-03 | Disposition: A | Discharge status: Home / Self Care | Payer: Medicare Other | Source: Ambulatory Visit | Attending: Endocrinology | Admitting: Endocrinology

## 2023-05-03 DIAGNOSIS — Z1231 Encounter for screening mammogram for malignant neoplasm of breast: Secondary | ICD-10-CM

## 2023-08-13 ENCOUNTER — Telehealth: Payer: Self-pay | Admitting: Cardiology

## 2023-08-13 MED ORDER — HYDROCHLOROTHIAZIDE 12.5 MG PO CAPS: 25.0000 mg | ORAL_CAPSULE | Freq: Every day | ORAL | 0 refills | Status: DC

## 2023-08-13 NOTE — Telephone Encounter (Signed)
 Pt's medication was sent to pt's pharmacy as requested. Confirmation received.

## 2023-08-13 NOTE — Telephone Encounter (Signed)
*  STAT* If patient is at the pharmacy, call can be transferred to refill team.   1. Which medications need to be refilled? (please list name of each medication and dose if known) hydrochlorothiazide (MICROZIDE) 12.5 MG capsule   2. Which pharmacy/location (including street and city if local pharmacy) is medication to be sent to? [facility] [id number] - [place], [place] - [address]    3. Do they need a 30 day or 90 day supply? 90

## 2023-11-06 NOTE — Progress Notes (Signed)
  Cardiology Office Note:   Date:  11/08/2023  ID:  Shasta, Chinn June 27, 1945, MRN [id number] PCP: Rosslyn Coons, MD  [facility] Providers Cardiologist:  Eilleen Grates, MD {  History of Present Illness:   Kristen Pena is a 78 y.o. female who presents for evaluaiton of an abnormal EKG . She had a right bundle branch block. There was an episode of syncope at that time but a monitor was unremarkable. Echocardiogram was unremarkable. It was thought to be vasovagal.  Of note her previous echo did suggest some impaired relaxation and questionable prolapsing mitral valve without regurgitation.  However, MR was not evident when I repeated this last year. She had only mild LVH.    A calcium  score in May of last year was minimally elevated.    She has been doing well.  She lives in [place].  The patient denies any new symptoms such as chest discomfort, neck or arm discomfort. There has been no new shortness of breath, PND or orthopnea. There have been no reported palpitations, presyncope or syncope.  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  She does exercise.  ROS: As stated in the HPI and negative for all other systems.  Studies Reviewed:    EKG:   EKG Interpretation Date/Time:  Thursday Nov 07 2023 10:09:34 EDT Ventricular Rate:  58 PR Interval:  216 QRS Duration:  162 QT Interval:  470 QTC Calculation: 461 R Axis:   -71  Text Interpretation: Sinus bradycardia with 1st degree A-V block Right bundle branch block Left anterior fascicular block Bifascicular block Minimal voltage criteria for LVH, may be normal variant ( R in aVL ) When compared with ECG of 23-Sep-2021 10:14, Premature atrial complexes are no longer Present Confirmed by Eilleen Grates ([id number]) on 11/07/2023 10:22:41 AM    Risk Assessment/Calculations:            Physical Exam:   VS:  BP (!) 150/82   Pulse 67   Ht 5\' 6"  (1.676 m)   Wt 156 lb (70.8 kg)   SpO2 98%   BMI 25.18 kg/m    Wt Readings  from Last 3 Encounters:  11/07/23 156 lb (70.8 kg)  10/31/22 159 lb 2 oz (72.2 kg)  11/28/21 158 lb (71.7 kg)     GEN: Well nourished, well developed in no acute distress NECK: No JVD; No carotid bruits CARDIAC: RRR, no murmurs, rubs, gallops RESPIRATORY:  Clear to auscultation without rales, wheezing or rhonchi  ABDOMEN: Soft, non-tender, non-distended EXTREMITIES:  No edema; No deformity   ASSESSMENT AND PLAN:      HTN: Her blood pressure is normal at home by her report.  She is going to keep a blood pressure diary.  She might well need adjustments to her medications.   DYSLIPIDEMIA: The LDL was 69.  No change in therapy.    BIFASICULAR BLOCK: She has no symptoms related to this.  We reviewed the physiology again.  No change in therapy.  She knows to call 911 if she has any syncope, dizziness or bradycardia.    Follow up with me in about a year.  Signed, Eilleen Grates, MD

## 2023-11-07 ENCOUNTER — Ambulatory Visit: Payer: Medicare Other | Attending: Cardiology | Admitting: Cardiology

## 2023-11-07 ENCOUNTER — Encounter: Payer: Self-pay | Admitting: Cardiology

## 2023-11-07 VITALS — BP 150/82 | HR 67 | Ht 66.0 in | Wt 156.0 lb

## 2023-11-07 DIAGNOSIS — E785 Hyperlipidemia, unspecified: Secondary | ICD-10-CM | POA: Diagnosis present

## 2023-11-07 DIAGNOSIS — I10 Essential (primary) hypertension: Secondary | ICD-10-CM | POA: Insufficient documentation

## 2023-11-07 DIAGNOSIS — I444 Left anterior fascicular block: Secondary | ICD-10-CM | POA: Insufficient documentation

## 2023-11-07 DIAGNOSIS — R072 Precordial pain: Secondary | ICD-10-CM | POA: Insufficient documentation

## 2023-11-07 MED ORDER — HYDROCHLOROTHIAZIDE 25 MG PO TABS: 25.0000 mg | ORAL_TABLET | Freq: Every day | ORAL | 3 refills | Status: AC

## 2023-11-07 NOTE — Patient Instructions (Signed)
 Medication Instructions:  Your physician has recommended you make the following change in your medication:   1) hydrochlorothiazide  has been changed, you will take 1 tablet daily  *If you need a refill on your cardiac medications before your next appointment, please call your pharmacy*  Follow-Up: At [facility], you and your health needs are our priority.  As part of our continuing mission to provide you with exceptional heart care, our providers are all part of one team.  This team includes your primary Cardiologist (physician) and Advanced Practice Providers or APPs (Physician Assistants and Nurse Practitioners) who all work together to provide you with the care you need, when you need it.  Your next appointment:   1 year(s)  Provider:   Eilleen Grates, MD   We recommend signing up for the patient portal called "MyChart".  Sign up information is provided on this After Visit Summary.  MyChart is used to connect with patients for Virtual Visits (Telemedicine).  Patients are able to view lab/test results, encounter notes, upcoming appointments, etc.  Non-urgent messages can be sent to your provider as well.   To learn more about what you can do with MyChart, go to [email or web address].   Other Instructions Please keep a blood pressure diary by checking your blood pressure at home 2-3 times per day for 2-3 weeks. Then call our office with a list of your readings, you may also send these via MyChart message if you prefer.  HOW TO TAKE YOUR BLOOD PRESSURE Rest 5 minutes before taking your blood pressure. Don't  smoke or drink caffeinated beverages for at least 30 minutes before. Take your blood pressure before (not after) you eat. Sit comfortably with your back supported and both feet on the floor ( don't cross your legs). Elevate your arm to heart level on a table or a desk. Use the proper sized cuff.  It should fit smoothly and snugly around your bare upper arm. There should  be enough room to slip a fingertip under the cuff.  The bottom edge of the cuff should be 1 inch above the crease of the elbow.  Please monitor your blood pressure once daily 2 hours after your am medication. If you blood pressure consistently remains above 140 (systolic) top number or over 90 ( diastolic) bottom number X 3 days  consecutively.  Please call our office at [phone] or send Mychart message.

## 2023-11-08 ENCOUNTER — Encounter: Payer: Self-pay | Admitting: Cardiology

## 2023-11-29 ENCOUNTER — Telehealth: Payer: Self-pay | Admitting: Cardiology

## 2023-11-29 NOTE — Telephone Encounter (Signed)
 Patient brought in BP reading. Will leave in providers box.

## 2023-12-01 ENCOUNTER — Other Ambulatory Visit: Payer: Self-pay | Admitting: Cardiology

## 2023-12-01 DIAGNOSIS — E876 Hypokalemia: Secondary | ICD-10-CM

## 2023-12-03 NOTE — Telephone Encounter (Signed)
 Blood pressure reading given to Dr. Lavonne Prairie 6/17

## 2023-12-05 ENCOUNTER — Telehealth: Payer: Self-pay

## 2023-12-05 NOTE — Telephone Encounter (Signed)
 Left voice message (per DPR) notifying the pt of Dr. Atlas Lea notes on the blood pressure readings she dropped off. Dr. Lavonne Prairie stated that her bp is at target and no medication changes necessary. Pt told to call with questions or concerns.

## 2024-02-29 ENCOUNTER — Other Ambulatory Visit: Payer: Self-pay | Admitting: Cardiology

## 2024-03-18 ENCOUNTER — Other Ambulatory Visit: Payer: Self-pay | Admitting: Endocrinology

## 2024-03-18 DIAGNOSIS — Z1231 Encounter for screening mammogram for malignant neoplasm of breast: Secondary | ICD-10-CM

## 2024-03-29 NOTE — Progress Notes (Unsigned)
 [facility] CONSULT NOTE  Patient Care Team: Nichole Senior, MD as PCP - General (Endocrinology) Lavona Agent, MD as PCP - Cardiology (Cardiology)   ASSESSMENT & PLAN 78 y.o. female with history of COPD, HTN, HLD being seen for lymphocytosis.  Outside labs showed mild lymphocytosis.  Clinically, patient is at baseline health without any new concerning symptoms or findings.  No lymphadenopathy or splenomegaly on exam.  Discussed possibility of CLL given her age and lymphocytosis.  We will obtain further testing.  Patient will return for follow-up in 3 weeks to go over results.  Assessment & Plan Lymphocytosis CBC, CMP, LDH, FISH and flow cytometry  Orders Placed This Encounter  Procedures   CBC with Differential (Cancer Center Only)    Standing Status:   Future    Number of Occurrences:   1    Expiration Date:   03/30/2025   CMP (Cancer Center only)    Standing Status:   Future    Number of Occurrences:   1    Expiration Date:   03/30/2025   Lactate dehydrogenase    Standing Status:   Future    Number of Occurrences:   1    Expiration Date:   03/30/2025   FISH CLL Leukemia    Standing Status:   Future    Number of Occurrences:   1    Expiration Date:   03/30/2025   Flow Cytometry, Peripheral Blood (Oncology)    Standing Status:   Future    Number of Occurrences:   1    Expiration Date:   03/30/2025    All questions were answered. The patient knows to call the clinic with any problems, questions or concerns.  [name], MD 03/30/2024 3:34 PM   CHIEF COMPLAINTS/PURPOSE OF CONSULTATION:  lymphocytosis  HISTORY OF PRESENTING ILLNESS:  Kristen Pena 78 y.o. female is here because of elevated WBC.  Recent records showed lymphocytosis from outside labs.  Patient report otherwise feeling well.  She denies any night sweats, weight loss, decreased appetite, lymphadenopathy.  She denies any new cough, short of breath, chest pain, abdominal pain,  nausea, vomiting, bloody stool or hematuria.  No other concerning symptoms.  She moved to [place] recently.     MEDICAL HISTORY:  Past Medical History:  Diagnosis Date   Allergic rhinitis    Allergy    Arthritis    left knee    Cataract    forming    GERD (gastroesophageal reflux disease)    PAST hx- diet controlled    Hyperlipidemia    Hypertension    Controlled on HCTZ   RBBB    Thyroid  disease     SURGICAL HISTORY: Past Surgical History:  Procedure Laterality Date   ABDOMINAL HYSTERECTOMY     COLONOSCOPY     POLYPECTOMY     UPPER GASTROINTESTINAL ENDOSCOPY      SOCIAL HISTORY: Social History   Socioeconomic History   Marital status: Single    Spouse name: Not on file   Number of children: 2   Years of education: Not on file   Highest education level: Not on file  Occupational History   Occupation: Restaurant  Tobacco Use   Smoking status: Never   Smokeless tobacco: Never  Vaping Use   Vaping status: Never Used  Substance and Sexual Activity   Alcohol  use: Yes    Alcohol /week: 0.0 standard drinks of alcohol     Comment: rare- 1 glass of wine per year  Drug use: No   Sexual activity: Not on file  Other Topics Concern   Not on file  Social History Narrative   Not on file   Social Drivers of Health   Financial Resource Strain: Low Risk  (03/30/2024)   Overall Financial Resource Strain (CARDIA)    Difficulty of Paying Living Expenses: Not hard at all  Food Insecurity: No Food Insecurity (03/30/2024)   Hunger Vital Sign    Worried About Running Out of Food in the Last Year: Never true    Ran Out of Food in the Last Year: Never true  Transportation Needs: No Transportation Needs (03/30/2024)   PRAPARE - Administrator, Civil Service (Medical): No    Lack of Transportation (Non-Medical): No  Physical Activity: Not on file  Stress: No Stress Concern Present (03/30/2024)   [employer] of Occupational Health - Occupational Stress  Questionnaire    Feeling of Stress: Not at all  Social Connections: Not on file  Intimate Partner Violence: Not At Risk (03/30/2024)   Humiliation, Afraid, Rape, and Kick questionnaire    Fear of Current or Ex-Partner: No    Emotionally Abused: No    Physically Abused: No    Sexually Abused: No    FAMILY HISTORY: Family History  Problem Relation Age of Onset   Colon cancer Mother 33   Stomach cancer Father    Dementia Brother    Colon polyps Neg Hx    Esophageal cancer Neg Hx    Rectal cancer Neg Hx     ALLERGIES:  is allergic to penicillins.  MEDICATIONS:  Current Outpatient Medications  Medication Sig Dispense Refill   Cholecalciferol 1.25 MG (50000 UT) capsule TAKE ONE CAPSULE BY MOUTH EVERY WEEK for 84     ezetimibe (ZETIA) 10 MG tablet Take 10 mg by mouth daily.     famotidine  (PEPCID ) 20 MG tablet TAKE 1 TABLET BY MOUTH DAILY AS NEEDED FOR HEARTBURN OR INDIGESTION 30 tablet 11   fluticasone (FLONASE SENSIMIST) 27.5 MCG/SPRAY nasal spray 1-2 sprays each nostril     hydrochlorothiazide  (HYDRODIURIL ) 25 MG tablet Take 1 tablet (25 mg total) by mouth daily. 90 tablet 3   levocetirizine (XYZAL) 5 MG tablet Take 5 mg by mouth daily.     montelukast (SINGULAIR) 10 MG tablet Take 10 mg by mouth daily.      potassium chloride  (KLOR-CON ) 10 MEQ tablet TAKE 1 TABLET BY MOUTH DAILY 90 tablet 3   rosuvastatin  (CRESTOR ) 10 MG tablet TAKE ONE TABLET BY MOUTH ONE TIME DAILY AT BEDTIME EXCEPT TUESDAY AND FRIDAY 60 tablet 2   omeprazole  (PRILOSEC) 20 MG capsule Take 1 capsule (20 mg total) by mouth daily for 14 days. 14 capsule 0   No current facility-administered medications for this visit.    REVIEW OF SYSTEMS:   All relevant systems were reviewed with the patient and are negative.  PHYSICAL EXAMINATION: ECOG PERFORMANCE STATUS: 0 - Asymptomatic  Vitals:   03/30/24 1439  BP: 137/79  Pulse: 72  Resp: 17  Temp: 98.1 F (36.7 C)  SpO2: 100%   Filed Weights   03/30/24 1439   Weight: 156 lb (70.8 kg)    GENERAL: alert, no distress and comfortable NECK: supple, non-tender, without nodularity LYMPH:  no palpable cervical, axillary or epitrochlear lymphadenopathy LUNGS: clear to auscultation and no wheezes, rales and with normal breathing effort HEART: regular rate & rhythm and no murmurs ABDOMEN: abdomen soft, non-tender and nondistended Musculoskeletal: and no lower extremity edema  LABORATORY DATA:  I have reviewed available outside records.  New labs ordered.

## 2024-03-30 ENCOUNTER — Inpatient Hospital Stay

## 2024-03-30 VITALS — BP 137/79 | HR 72 | Temp 98.1°F | Resp 17 | Ht 66.0 in | Wt 156.0 lb

## 2024-03-30 DIAGNOSIS — E785 Hyperlipidemia, unspecified: Secondary | ICD-10-CM | POA: Diagnosis not present

## 2024-03-30 DIAGNOSIS — J449 Chronic obstructive pulmonary disease, unspecified: Secondary | ICD-10-CM | POA: Insufficient documentation

## 2024-03-30 DIAGNOSIS — I10 Essential (primary) hypertension: Secondary | ICD-10-CM | POA: Diagnosis not present

## 2024-03-30 DIAGNOSIS — D72820 Lymphocytosis (symptomatic): Secondary | ICD-10-CM

## 2024-03-30 LAB — CBC WITH DIFFERENTIAL (CANCER CENTER ONLY)
Abs Immature Granulocytes: 0.02 K/uL (ref 0.00–0.07)
Basophils Absolute: 0 K/uL (ref 0.0–0.1)
Basophils Relative: 0 %
Eosinophils Absolute: 0 K/uL (ref 0.0–0.5)
Eosinophils Relative: 0 %
HCT: 43.6 % (ref 36.0–46.0)
Hemoglobin: 14.7 g/dL (ref 12.0–15.0)
Immature Granulocytes: 0 %
Lymphocytes Relative: 83 %
Lymphs Abs: 13 K/uL — ABNORMAL HIGH (ref 0.7–4.0)
MCH: 29.8 pg (ref 26.0–34.0)
MCHC: 33.7 g/dL (ref 30.0–36.0)
MCV: 88.3 fL (ref 80.0–100.0)
Monocytes Absolute: 0.4 K/uL (ref 0.1–1.0)
Monocytes Relative: 3 %
Neutro Abs: 2.2 K/uL (ref 1.7–7.7)
Neutrophils Relative %: 14 %
Platelet Count: 158 K/uL (ref 150–400)
RBC: 4.94 MIL/uL (ref 3.87–5.11)
RDW: 13.3 % (ref 11.5–15.5)
Smear Review: NORMAL
WBC Count: 15.8 K/uL — ABNORMAL HIGH (ref 4.0–10.5)
nRBC: 0 % (ref 0.0–0.2)

## 2024-03-30 LAB — LACTATE DEHYDROGENASE: LDH: 177 U/L (ref 98–192)

## 2024-03-30 LAB — CMP (CANCER CENTER ONLY)
ALT: 22 U/L (ref 0–44)
AST: 25 U/L (ref 15–41)
Albumin: 4.5 g/dL (ref 3.5–5.0)
Alkaline Phosphatase: 74 U/L (ref 38–126)
Anion gap: 5 (ref 5–15)
BUN: 18 mg/dL (ref 8–23)
CO2: 33 mmol/L — ABNORMAL HIGH (ref 22–32)
Calcium: 10.2 mg/dL (ref 8.9–10.3)
Chloride: 105 mmol/L (ref 98–111)
Creatinine: 1 mg/dL (ref 0.44–1.00)
GFR, Estimated: 57 mL/min — ABNORMAL LOW (ref >60)
Glucose, Bld: 67 mg/dL — ABNORMAL LOW (ref 70–99)
Potassium: 3.3 mmol/L — ABNORMAL LOW (ref 3.5–5.1)
Sodium: 143 mmol/L (ref 135–145)
Total Bilirubin: 0.5 mg/dL (ref 0.0–1.2)
Total Protein: 6.9 g/dL (ref 6.5–8.1)

## 2024-04-01 LAB — SURGICAL PATHOLOGY

## 2024-04-02 LAB — FLOW CYTOMETRY

## 2024-04-04 LAB — FISH HES LEUKEMIA, 4Q12 REA

## 2024-04-17 NOTE — Progress Notes (Signed)
 [facility] OFFICE PROGRESS NOTE  Patient Care Team: Nichole Senior, MD as PCP - General (Endocrinology) Lavona Agent, MD as PCP - Cardiology (Cardiology)  78 y.o. female with history of COPD, HTN, HLD being seen for lymphocytosis.   Outside labs showed mild lymphocytosis.   Clinically, patient is at baseline health without any new concerning symptoms or findings.  No lymphadenopathy or splenomegaly on exam.   CLL CG was normal.  Flow showed positive for CD11c, CD19, CD20, CD22 and express lambda light chains.  They are negative for CD5 and CD10.  The findings are concerning for involvement by a CD5/CD10 negative mature B-cell lymphoma. HGB, Platelet and LDH were normal.  Discussed results and indolent BCL.  Will obtain baseline CT scan to evaluate for lymphadenopathy. Assessment & Plan Lymphoma, low grade (HCC) CT neck chest abd and pelvis ordered Follow up in about 3 weeks to go over results.  Orders Placed This Encounter  Procedures   CT CHEST ABDOMEN PELVIS W CONTRAST    Standing Status:   Future    Expected Date:   05/04/2024    Expiration Date:   04/20/2025    If indicated for the ordered procedure, I authorize the administration of contrast media per Radiology protocol:   Yes    Does the patient have a contrast media/X-ray dye allergy?:   No    Preferred imaging location?:   [facility]    If indicated for the ordered procedure, I authorize the administration of oral contrast media per Radiology protocol:   Yes   CT Soft Tissue Neck W Contrast    Standing Status:   Future    Expected Date:   05/04/2024    Expiration Date:   04/20/2025    If indicated for the ordered procedure, I authorize the administration of contrast media per Radiology protocol:   Yes    Does the patient have a contrast media/X-ray dye allergy?:   No    Preferred imaging location?:   [facility]     [name], MD  INTERVAL HISTORY: Patient returns for  follow-up.  No night sweats, weight loss or decreased appetite, mass or lymph node swelling. No UTI symptoms.   PHYSICAL EXAMINATION: ECOG PERFORMANCE STATUS: 1  Vitals:   04/20/24 1436  BP: 136/70  Pulse: 82  Resp: 20  Temp: 98.1 F (36.7 C)  SpO2: 99%   Filed Weights   04/20/24 1436  Weight: 158 lb 9.6 oz (71.9 kg)    GENERAL: alert, no distress and comfortable NECK: No palpable mass LYMPH:  no palpable cervical, axillary lymphadenopathy  LUNGS: clear to auscultation and no wheeze or rales with normal breathing effort HEART: regular rate & rhythm  ABDOMEN: abdomen soft, non-tender and nondistended. Musculoskeletal: no edema   Relevant data reviewed during this visit included labs.  New Ct ordered.

## 2024-04-20 ENCOUNTER — Inpatient Hospital Stay

## 2024-04-20 VITALS — BP 136/70 | HR 82 | Temp 98.1°F | Resp 20 | Wt 158.6 lb

## 2024-04-20 DIAGNOSIS — C8590 Non-Hodgkin lymphoma, unspecified, unspecified site: Secondary | ICD-10-CM | POA: Diagnosis not present

## 2024-05-04 ENCOUNTER — Ambulatory Visit
Admission: RE | Admit: 2024-05-04 | Discharge: 2024-05-04 | Disposition: A | Discharge status: Home / Self Care | Source: Ambulatory Visit | Attending: Endocrinology | Admitting: Endocrinology

## 2024-05-04 DIAGNOSIS — Z1231 Encounter for screening mammogram for malignant neoplasm of breast: Secondary | ICD-10-CM

## 2024-05-07 ENCOUNTER — Ambulatory Visit
Admission: RE | Admit: 2024-05-07 | Discharge: 2024-05-07 | Disposition: A | Discharge status: Home / Self Care | Source: Ambulatory Visit

## 2024-05-07 DIAGNOSIS — C8590 Non-Hodgkin lymphoma, unspecified, unspecified site: Secondary | ICD-10-CM | POA: Insufficient documentation

## 2024-05-07 MED ORDER — SODIUM CHLORIDE 0.9 % IV SOLN: INTRAVENOUS | Status: DC

## 2024-05-07 MED ORDER — BARIUM SULFATE 2 % PO SUSP
450.0000 mL | ORAL | Status: AC
  Administered 2024-05-07 (×2): 450 mL via ORAL

## 2024-05-07 MED ORDER — IOHEXOL 300 MG/ML  SOLN
100.0000 mL | Freq: Once | INTRAMUSCULAR | Status: AC | PRN
  Administered 2024-05-07: 100 mL via INTRAVENOUS

## 2024-05-10 NOTE — Progress Notes (Unsigned)
 [facility] OFFICE PROGRESS NOTE  Patient Care Team: Nichole Senior, MD as PCP - General (Endocrinology) Lavona Agent, MD as PCP - Cardiology (Cardiology)  78 y.o. female with history of COPD, HTN, HLD being seen for lymphocytosis.   Outside labs showed mild lymphocytosis.   Clinically, patient is at baseline health without any new concerning symptoms or findings.  No lymphadenopathy or splenomegaly on exam.    CLL CG was normal.  Flow showed positive for CD11c, CD19, CD20, CD22 and express lambda light chains.  CD5 and CD10 negative.  The findings are concerning for involvement by a CD5/CD10 negative mature B-cell lymphoma. HGB, Platelet and LDH were normal.   Discussed results and indolent BCL.   CT scan showed no significant lymphadenopathy. Borderline splenomegaly. SMZL is possible will obtain hep C. Assessment & Plan   No orders of the defined types were placed in this encounter.    [name], MD  INTERVAL HISTORY: Patient returns for follow-up.  Oncology History   No history exists.     PHYSICAL EXAMINATION: ECOG PERFORMANCE STATUS: {CHL ONC ECOG PS:[id number]}  There were no vitals filed for this visit. There were no vitals filed for this visit.  GENERAL: alert, no distress and comfortable SKIN: skin color normal and no jaundice or bruising or petechiae on exposed skin EYES: normal, sclera clear OROPHARYNX: no exudate  NECK: No palpable mass LYMPH:  no palpable cervical, axillary lymphadenopathy  LUNGS: clear to auscultation and no wheeze or rales with normal breathing effort HEART: regular rate & rhythm  ABDOMEN: abdomen soft, non-tender and nondistended. Musculoskeletal: no edema NEURO: no focal motor/sensory deficits  Relevant data reviewed during this visit included labs.  New labs ordered.

## 2024-05-12 ENCOUNTER — Ambulatory Visit: Payer: Self-pay

## 2024-05-12 ENCOUNTER — Inpatient Hospital Stay

## 2024-05-12 ENCOUNTER — Inpatient Hospital Stay (HOSPITAL_BASED_OUTPATIENT_CLINIC_OR_DEPARTMENT_OTHER)

## 2024-05-12 VITALS — BP 157/79 | HR 78 | Temp 98.1°F | Resp 18 | Wt 158.6 lb

## 2024-05-12 DIAGNOSIS — C8510 Unspecified B-cell lymphoma, unspecified site: Secondary | ICD-10-CM | POA: Insufficient documentation

## 2024-05-12 DIAGNOSIS — C8517 Unspecified B-cell lymphoma, spleen: Secondary | ICD-10-CM

## 2024-05-12 LAB — HEPATITIS C ANTIBODY: HCV Ab: NONREACTIVE

## 2024-05-12 NOTE — Assessment & Plan Note (Addendum)
 No pathologic enlarged lymph node Mild splenomegaly with normal hemoglobin and platelet Will check hepatitis C today Follow-up in February with repeat labs.

## 2024-05-18 NOTE — Telephone Encounter (Signed)
 Notified that Hep C is negative

## 2024-08-17 ENCOUNTER — Inpatient Hospital Stay

## 2024-11-05 ENCOUNTER — Ambulatory Visit: Admitting: Cardiology
